# Patient Record
Sex: Male | Born: 1955 | Race: White | Hispanic: Refuse to answer | Marital: Married | Smoking: Former smoker
Health system: Southern US, Community
[De-identification: ages and names within clinical notes are randomized; demographics above are authoritative.]

## PROBLEM LIST (undated history)

## (undated) DIAGNOSIS — K620 Anal polyp: Secondary | ICD-10-CM

## (undated) DIAGNOSIS — G4761 Periodic limb movement disorder: Secondary | ICD-10-CM

## (undated) DIAGNOSIS — M109 Gout, unspecified: Secondary | ICD-10-CM

## (undated) DIAGNOSIS — E785 Hyperlipidemia, unspecified: Secondary | ICD-10-CM

## (undated) DIAGNOSIS — E119 Type 2 diabetes mellitus without complications: Secondary | ICD-10-CM

## (undated) DIAGNOSIS — G510 Bell's palsy: Secondary | ICD-10-CM

## (undated) DIAGNOSIS — E669 Obesity, unspecified: Secondary | ICD-10-CM

## (undated) DIAGNOSIS — J019 Acute sinusitis, unspecified: Secondary | ICD-10-CM

## (undated) DIAGNOSIS — I2510 Atherosclerotic heart disease of native coronary artery without angina pectoris: Secondary | ICD-10-CM

## (undated) DIAGNOSIS — E041 Nontoxic single thyroid nodule: Secondary | ICD-10-CM

## (undated) DIAGNOSIS — E559 Vitamin D deficiency, unspecified: Secondary | ICD-10-CM

## (undated) DIAGNOSIS — E042 Nontoxic multinodular goiter: Secondary | ICD-10-CM

## (undated) DIAGNOSIS — K5090 Crohn's disease, unspecified, without complications: Secondary | ICD-10-CM

## (undated) DIAGNOSIS — G459 Transient cerebral ischemic attack, unspecified: Secondary | ICD-10-CM

## (undated) DIAGNOSIS — G4730 Sleep apnea, unspecified: Secondary | ICD-10-CM

## (undated) DIAGNOSIS — I10 Essential (primary) hypertension: Secondary | ICD-10-CM

## (undated) HISTORY — DX: Acute sinusitis, unspecified: J01.90

## (undated) HISTORY — DX: Bell's palsy: G51.0

## (undated) HISTORY — DX: Gout, unspecified: M10.9

## (undated) HISTORY — DX: Transient cerebral ischemic attack, unspecified: G45.9

## (undated) HISTORY — DX: Vitamin D deficiency, unspecified: E55.9

## (undated) HISTORY — DX: Nontoxic multinodular goiter: E04.2

## (undated) HISTORY — DX: Nontoxic single thyroid nodule: E04.1

## (undated) HISTORY — DX: Sleep apnea, unspecified: G47.30

## (undated) HISTORY — DX: Crohn's disease, unspecified, without complications: K50.90

## (undated) HISTORY — PX: RECTAL POLYPECTOMY: SHX2309

## (undated) HISTORY — DX: Atherosclerotic heart disease of native coronary artery without angina pectoris: I25.10

## (undated) HISTORY — DX: Periodic limb movement disorder: G47.61

## (undated) HISTORY — DX: Obesity, unspecified: E66.9

## (undated) HISTORY — DX: Anal polyp: K62.0

---
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
## 1997-12-02 ENCOUNTER — Inpatient Hospital Stay (HOSPITAL_COMMUNITY): Admission: EM | Admit: 1997-12-02 | Discharge: 1997-12-03 | Payer: Self-pay | Admitting: Emergency Medicine

## 1997-12-02 ENCOUNTER — Encounter: Payer: Self-pay | Admitting: Emergency Medicine

## 1997-12-03 ENCOUNTER — Encounter: Payer: Self-pay | Admitting: Internal Medicine

## 1997-12-06 ENCOUNTER — Observation Stay (HOSPITAL_COMMUNITY): Admission: AD | Admit: 1997-12-06 | Discharge: 1997-12-07 | Payer: Self-pay | Admitting: Cardiology

## 1998-03-05 HISTORY — PX: OTHER SURGICAL HISTORY: SHX169

## 1998-03-15 ENCOUNTER — Observation Stay (HOSPITAL_COMMUNITY): Admission: AD | Admit: 1998-03-15 | Discharge: 1998-03-16 | Payer: Self-pay | Admitting: Cardiology

## 1998-04-24 ENCOUNTER — Encounter: Payer: Self-pay | Admitting: Cardiology

## 1998-04-24 ENCOUNTER — Emergency Department (HOSPITAL_COMMUNITY): Admission: EM | Admit: 1998-04-24 | Discharge: 1998-04-24 | Payer: Self-pay | Admitting: *Deleted

## 1998-05-15 ENCOUNTER — Ambulatory Visit (HOSPITAL_COMMUNITY): Admission: RE | Admit: 1998-05-15 | Discharge: 1998-05-15 | Payer: Self-pay | Admitting: Cardiology

## 1998-05-22 ENCOUNTER — Ambulatory Visit (HOSPITAL_COMMUNITY): Admission: RE | Admit: 1998-05-22 | Discharge: 1998-05-22 | Payer: Self-pay | Admitting: Gastroenterology

## 1998-05-22 ENCOUNTER — Encounter: Payer: Self-pay | Admitting: Gastroenterology

## 1998-08-19 ENCOUNTER — Emergency Department (HOSPITAL_COMMUNITY): Admission: EM | Admit: 1998-08-19 | Discharge: 1998-08-19 | Payer: Self-pay | Admitting: Otolaryngology

## 1998-08-19 ENCOUNTER — Encounter: Payer: Self-pay | Admitting: Interventional Cardiology

## 1998-08-19 ENCOUNTER — Encounter: Payer: Self-pay | Admitting: Emergency Medicine

## 1998-12-03 ENCOUNTER — Ambulatory Visit (HOSPITAL_COMMUNITY): Admission: RE | Admit: 1998-12-03 | Discharge: 1998-12-03 | Payer: Self-pay | Admitting: Gastroenterology

## 1998-12-03 ENCOUNTER — Encounter: Payer: Self-pay | Admitting: Gastroenterology

## 1998-12-03 ENCOUNTER — Encounter (INDEPENDENT_AMBULATORY_CARE_PROVIDER_SITE_OTHER): Payer: Self-pay | Admitting: Specialist

## 1999-01-13 ENCOUNTER — Encounter: Admission: RE | Admit: 1999-01-13 | Discharge: 1999-04-13 | Payer: Self-pay | Admitting: *Deleted

## 2000-11-29 ENCOUNTER — Encounter: Payer: Self-pay | Admitting: Gastroenterology

## 2000-11-29 ENCOUNTER — Encounter: Admission: RE | Admit: 2000-11-29 | Discharge: 2000-11-29 | Payer: Self-pay | Admitting: Gastroenterology

## 2001-04-20 ENCOUNTER — Inpatient Hospital Stay (HOSPITAL_COMMUNITY): Admission: AD | Admit: 2001-04-20 | Discharge: 2001-04-21 | Payer: Self-pay | Admitting: Interventional Cardiology

## 2003-06-09 ENCOUNTER — Ambulatory Visit (HOSPITAL_BASED_OUTPATIENT_CLINIC_OR_DEPARTMENT_OTHER): Admission: RE | Admit: 2003-06-09 | Discharge: 2003-06-09 | Payer: Self-pay | Admitting: Interventional Cardiology

## 2005-08-24 ENCOUNTER — Emergency Department (HOSPITAL_COMMUNITY): Admission: EM | Admit: 2005-08-24 | Discharge: 2005-08-24 | Payer: Self-pay | Admitting: Emergency Medicine

## 2006-02-02 HISTORY — PX: HEEL SPUR EXCISION: SHX1733

## 2006-05-07 ENCOUNTER — Ambulatory Visit (HOSPITAL_COMMUNITY): Admission: RE | Admit: 2006-05-07 | Discharge: 2006-05-07 | Payer: Self-pay | Admitting: General Surgery

## 2006-05-07 ENCOUNTER — Encounter (INDEPENDENT_AMBULATORY_CARE_PROVIDER_SITE_OTHER): Payer: Self-pay | Admitting: Specialist

## 2007-11-25 ENCOUNTER — Encounter: Admission: RE | Admit: 2007-11-25 | Discharge: 2007-11-25 | Payer: Self-pay | Admitting: Internal Medicine

## 2010-06-20 NOTE — H&P (Signed)
[facility]  Patient:    Jesus Stone, Jesus Stone Visit Number: [id number] MRN: [id number]          Service Type: MED Location: 2000 2017 01 Attending Physician:  Lyn Records. Iii Dictated by:   Anselm Lis, N.P. Admit Date:  04/20/2001   CC:         Jesus Stone, M.D.   History and Physical  PRIMARY CARE Jesus Stone:  Jesus Stone, M.D.  DATE OF BIRTH:  [birth year]  IMPRESSION: 1. Chest discomfort, similar, though less intense, to his typical angina.    Onset this morning.  Gradually relieved with five sprays of sublingual    nitrates.  His EKG in the clinic today was non-ischemic.  He has a history    of stent in the ramus placed in February of 2002.  Prior to this, he had a    PTCA of that same ramus.  Most recently a stress Cardiolite in July of 2000    was negative for ischemia with an EF of 56%. 2. History of Crohns disease followed by Jesus Stone, M.D.  On Asacol,    Purinethol, and p.r.n. Flagyl.  In 1993 Jesus Stone, M.D.) had    removal of terminal ileus (two feet of colon).  This surgery was    complicated by subsequent severe peritonitis and drainage of an abscess (he    was critically ill). 3. History of appendectomy in 1964 and testicular surgery in 1967. 4. Hyperlipidemia on Lopid with starting of Lipitor on March 30, 2001.  On    March 30, 2001, the cholesterol was 188, triglycerides 232, HDL 32, and    LDL 119. 5. History of elevated liver function tests elevated in the past by Jesus Stone L.    Randa Stone, M.D.  Liver biopsy in 2000 revealing steatohepatitis without    scarring.  Thought non-alcohol steatohepatitis (NASH).  Treatment would be    aggressive weight loss and low-fat diet.  PLAN: 1. The patient will be admitted to the hospital (telemetry) for rule out MI    protocol with serial cardiac enzymes and daily EKG. 2. Beta blocker therapy. 3. Continued other medications as before. 4. Further  recommendations and testing pending hospital course. 5. PPI.  HISTORY OF PRESENT ILLNESS:  Jesus Stone is a pleasant 55 year old male with a known history of dyslipidemia and single vessel coronary atherosclerotic heart disease; status post stent of ramus in 1999 with subsequently PTCA/stent of restenosis of ramus.  Yesterday morning, the patient had mild left anterior chest pressure which was transient (non-exertional).  This morning about 9 a.m., he had recurrent episodes of the same, but more intense.  Noted associated left neck and shoulder discomfort with swelling of left fingers.  Positive diaphoresis, but no nausea or shortness of breath.  The left anterior chest discomfort was described as "sharp/pressure" and continuous.  Five sprays of sublingual nitrates resulted in gradual relief over the subsequent hour.  In our clinic today, EKG revealed NSR without ischemia.  He is currently with only very mild anterior chest discomfort.  This is not like his GERD symptoms. He has residual malaise.  ALLERGIES:  No known drug allergies.  Okay with seafood, shellfish, and iodine products.  MEDICATIONS: 1. Gemfibrozil 300 mg p.o. b.i.d. 2. Asacol 40 mg two tablets p.o. t.i.d. 3. Purinethol 50 mg one-and-a-half tablets p.o. q.d. 4. Flagyl 250 mg t.i.d. p.r.n. for a 10-day course. 5. Aspirin 325 mg p.o. q.d. 6. Sublingual nitrates  p.r.n. 7. Multivitamins with vitamin C once daily. 8. Lipitor 5 mg p.o. q.d. started on March 30, 2001.  SOCIAL HISTORY AND HABITS:  Tobacco:  Remote.  He did smoke two-and-a-half packs per day for age [age].  He has not smoked in the last 17 years.  ETOH: Negative.  Caffeine:  Intermittently a bit in the morning.  Works as a Advice worker at [employer].  He is married.  His wife is 35 and in good health.  He has a 58 year old daughter and a 67 year old son in good health.  FAMILY HISTORY:  His father died at age 44 with question of a heart  attack. His dad had rheumatic heart disease following scarlet fever as a child.  His mother has had breast cancer, but is age 26 and in good health.  The patient has one sister, age 6, with obesity, hyperglycemia, irritable bowel syndrome, and migraine headaches and a 71 year old brother in good health.  REVIEW OF SYSTEMS:  As HPI, social history, and past medical history, otherwise denies lightheadedness, dizziness, syncope, and near syncopal episodes.  He does have episodic symptoms of heartburn, feeling as if something gets stuck in his esophagus.  Denies orthopnea nor PND.  Negative pedal edema.  PHYSICAL EXAMINATION:  The blood pressure is 130/90, temperature 97.9 degrees, respiratory rate 16, and O2 saturation 99% on room air.  Initially the blood pressure was 170/120 (small cuff) and recheck 130/92.  WEIGHT:  200 pounds.  GENERAL APPEARANCE:  He is a well-nourished, overweight, 55 year old male in no apparent distress.  Alert and pleasantly conversant.  NECK:  Brisk bilateral carotid upstroke without bruit.  No significant JVD. No thyromegaly.  CHEST:  Lung sounds clear with distant scant left basilar crackles, inspiratory.  Negative CVA tenderness.  CARDIAC:  Regular rate and rhythm without murmurs, rubs, or gallops.  Normal S1 and S2.  ABDOMEN:  Obese, soft, and nondistended.  Normoactive bowel sounds.  Negative abdominal aorta, renal, or femoral bruit.  Nontender to applied pressure.  No masses.  No organomegaly appreciated.  EXTREMITIES:  Distal pulses intact.  Negative pedal edema.  NEUROLOGIC:  Cranial nerves II-XII grossly intact.  Alert and oriented x 3.  GENITALIA:  Exam deferred.  RECTAL:  Exam deferred.  LABORATORY TESTS AND DATA:  EKG revealed NSR at 68 beats per minute with no ischemic changes.  His WBC was 4.1, hemoglobin 12.9, hematocrit 36.5, and platelets 253.  Pro time 13.9, INR 1.0, PTT 24.  Sodium 138, K 4.3, chloride 107, CO2 23,  glucose  99, BUN 12, creatinine 1.0.  LFTs within normal range, though SGOT elevated at 73 and SGPT elevated at 74.  This was compared to prior labs on March 30, 2001, when the SGOT was 85 and the SGPT was 114.  Total protein 7.7, albumin 3.8, calcium 9.2.  CK 107, MB fraction 1.2, troponin I 0.01.  Laboratory work from March 30, 2001, revealed a cholesterol of 188, triglycerides 232, HDL 32, and LDL 119.  His UA on March 30, 2001, was negative. Dictated by:   Anselm Lis, N.P. Attending Physician:  Lyn Records. Iii DD:  04/20/01 TD:  04/21/01 Job: [id number]

## 2010-06-20 NOTE — Op Note (Signed)
NAMEABU, HEAVIN              ACCOUNT NO.:  [id number]   MEDICAL RECORD NO.:  [id number]          PATIENT TYPE:  AMB   LOCATION:  DAY                          FACILITY:  [facility]   PHYSICIAN:  Leonie Man, M.D.   DATE OF BIRTH:  1956-01-04   DATE OF PROCEDURE:  05/07/2006  DATE OF DISCHARGE:                               OPERATIVE REPORT   PREOPERATIVE DIAGNOSIS:  Anal polyp and Crohn disease.   POSTOPERATIVE DIAGNOSIS:  Anal polyp and Crohn disease.   PROCEDURE:  Examination under anesthesia, excision of anal polyp and  proctosigmoidoscopy.   SURGEON:  Leonie Man, M.D.   ASSISTANT:  OR technician.   ANESTHESIA:  General.   POSITION:  Prone.   SPECIMENS TO THE LAB:  Anal polyp.   COMPLICATIONS:  None.  The patient returned to the PACU in excellent  condition.   NOTE:  Mr. Ketcher is a 55 year old gentleman with a diagnosis of Crohn  disease currently on multiple medications for this including  mercaptopurine, Asacol.  He was seen recently by Dr. Vilinda Boehringer and  noted to have a large hypertrophic polyp.  Biopsy at that time had shown  only a hypertrophic polyp.  Because the remainder of the polyp is in  place, the patient was referred for further evaluation.   PROCEDURE REPORT:  The patient is positioned in the prone jackknife  position and the perianal tissues prepped and draped to be included in  the sterile operative field following the induction of satisfactory  general anesthesia.  Anal dilatation carried to be 3 fingerbreadths and  after a perianal block of 0.5% Marcaine with epinephrine was done.  The  operating anoscope was introduced into the anus and upon inspection  there was first an anterior midline fissure which was relatively acute,  as well as a hypertrophic polyp positioned at approximately the 8  o'clock position with the patient in the prone position.  The polyp was  grasped and with the use of electric cautery.  The entire base of the  polyp  along with a portion of the mucosa was removed in its entirety and  forwarded for pathologic evaluation.  There was no bleeding.  I then did  a very thorough and visual evaluation of the anorectum.  There were no  additional lesions noted in this region.  I then used a rigid  sigmoidoscope and sigmoidoscoped the patient up to 20 cm with no  additional lesions being noted.  The patient did have a significantly  increased amount of mucus within the colon and rectum and this is  consistent with his Crohn disease.  There were no additional  hypertrophic polyps.  There were no  areas of stricturing or inflammation noted.  I then placed a Gelfoam pad  over the anterior anal fissure, placed a sterile dressing on the  incision.  The anesthetic was then reversed and the patient removed from  the operating room to the recovery room in stable condition.  He  tolerated the procedure well.      Leonie Man, M.D.  Electronically Signed     PB/MEDQ  D:  05/07/2006  T:  05/07/2006  Job:  [id number]   cc:   Fayrene Fearing L. Malon Kindle., M.D.  Fax: [phone]   Candyce Churn, M.D.  Fax: [phone]

## 2011-07-28 ENCOUNTER — Other Ambulatory Visit (HOSPITAL_COMMUNITY): Payer: Self-pay | Admitting: Internal Medicine

## 2011-07-28 DIAGNOSIS — E041 Nontoxic single thyroid nodule: Secondary | ICD-10-CM

## 2011-08-04 ENCOUNTER — Ambulatory Visit (HOSPITAL_COMMUNITY): Payer: Self-pay

## 2011-08-04 ENCOUNTER — Ambulatory Visit (HOSPITAL_COMMUNITY)
Admission: RE | Admit: 2011-08-04 | Discharge: 2011-08-04 | Disposition: A | Discharge status: Home / Self Care | Payer: 59 | Source: Ambulatory Visit | Attending: Internal Medicine | Admitting: Internal Medicine

## 2011-08-04 ENCOUNTER — Encounter (HOSPITAL_COMMUNITY)
Admission: RE | Admit: 2011-08-04 | Discharge: 2011-08-04 | Disposition: A | Discharge status: Home / Self Care | Payer: 59 | Source: Ambulatory Visit | Attending: Internal Medicine | Admitting: Internal Medicine

## 2011-08-04 ENCOUNTER — Other Ambulatory Visit (HOSPITAL_COMMUNITY): Payer: Self-pay | Admitting: Internal Medicine

## 2011-08-04 DIAGNOSIS — E041 Nontoxic single thyroid nodule: Secondary | ICD-10-CM

## 2011-08-04 DIAGNOSIS — R220 Localized swelling, mass and lump, head: Secondary | ICD-10-CM | POA: Insufficient documentation

## 2011-08-04 DIAGNOSIS — E049 Nontoxic goiter, unspecified: Secondary | ICD-10-CM | POA: Insufficient documentation

## 2011-08-04 MED ORDER — SODIUM PERTECHNETATE TC 99M INJECTION
10.0000 | Freq: Once | INTRAVENOUS | Status: AC | PRN
  Administered 2011-08-04: 10 via INTRAVENOUS

## 2011-08-05 ENCOUNTER — Encounter (HOSPITAL_COMMUNITY): Payer: Self-pay

## 2011-08-05 ENCOUNTER — Other Ambulatory Visit (HOSPITAL_COMMUNITY): Payer: 59

## 2011-09-30 ENCOUNTER — Other Ambulatory Visit: Payer: Self-pay | Admitting: Internal Medicine

## 2011-09-30 DIAGNOSIS — E041 Nontoxic single thyroid nodule: Secondary | ICD-10-CM

## 2011-10-01 ENCOUNTER — Ambulatory Visit
Admission: RE | Admit: 2011-10-01 | Discharge: 2011-10-01 | Disposition: A | Discharge status: Home / Self Care | Payer: 59 | Source: Ambulatory Visit | Attending: Internal Medicine | Admitting: Internal Medicine

## 2011-10-01 ENCOUNTER — Other Ambulatory Visit (HOSPITAL_COMMUNITY)
Admission: RE | Admit: 2011-10-01 | Discharge: 2011-10-01 | Disposition: A | Discharge status: Home / Self Care | Payer: 59 | Source: Ambulatory Visit | Attending: Interventional Radiology | Admitting: Interventional Radiology

## 2011-10-01 DIAGNOSIS — E041 Nontoxic single thyroid nodule: Secondary | ICD-10-CM

## 2011-10-01 DIAGNOSIS — E049 Nontoxic goiter, unspecified: Secondary | ICD-10-CM | POA: Insufficient documentation

## 2012-04-01 ENCOUNTER — Other Ambulatory Visit: Payer: Self-pay | Admitting: Internal Medicine

## 2012-04-01 DIAGNOSIS — E049 Nontoxic goiter, unspecified: Secondary | ICD-10-CM

## 2012-04-05 ENCOUNTER — Ambulatory Visit
Admission: RE | Admit: 2012-04-05 | Discharge: 2012-04-05 | Disposition: A | Discharge status: Home / Self Care | Payer: 59 | Source: Ambulatory Visit | Attending: Internal Medicine | Admitting: Internal Medicine

## 2012-04-05 DIAGNOSIS — E049 Nontoxic goiter, unspecified: Secondary | ICD-10-CM

## 2013-02-22 ENCOUNTER — Ambulatory Visit
Admission: RE | Admit: 2013-02-22 | Discharge: 2013-02-22 | Disposition: A | Discharge status: Home / Self Care | Payer: 59 | Source: Ambulatory Visit | Attending: Internal Medicine | Admitting: Internal Medicine

## 2013-02-22 ENCOUNTER — Other Ambulatory Visit: Payer: Self-pay | Admitting: Internal Medicine

## 2013-02-22 DIAGNOSIS — R51 Headache: Principal | ICD-10-CM

## 2013-02-22 DIAGNOSIS — R519 Headache, unspecified: Secondary | ICD-10-CM

## 2013-03-01 ENCOUNTER — Ambulatory Visit (INDEPENDENT_AMBULATORY_CARE_PROVIDER_SITE_OTHER): Payer: 59

## 2013-03-01 ENCOUNTER — Telehealth: Payer: Self-pay | Admitting: Diagnostic Neuroimaging

## 2013-03-01 ENCOUNTER — Ambulatory Visit (INDEPENDENT_AMBULATORY_CARE_PROVIDER_SITE_OTHER): Payer: 59 | Admitting: Diagnostic Neuroimaging

## 2013-03-01 ENCOUNTER — Encounter: Payer: Self-pay | Admitting: Diagnostic Neuroimaging

## 2013-03-01 ENCOUNTER — Encounter (INDEPENDENT_AMBULATORY_CARE_PROVIDER_SITE_OTHER): Payer: Self-pay

## 2013-03-01 VITALS — BP 159/84 | HR 68 | Temp 96.7°F | Ht 71.0 in | Wt 250.0 lb

## 2013-03-01 DIAGNOSIS — R42 Dizziness and giddiness: Secondary | ICD-10-CM

## 2013-03-01 DIAGNOSIS — M542 Cervicalgia: Secondary | ICD-10-CM

## 2013-03-01 DIAGNOSIS — G459 Transient cerebral ischemic attack, unspecified: Secondary | ICD-10-CM

## 2013-03-01 DIAGNOSIS — R209 Unspecified disturbances of skin sensation: Secondary | ICD-10-CM

## 2013-03-01 DIAGNOSIS — R51 Headache: Secondary | ICD-10-CM

## 2013-03-01 DIAGNOSIS — R200 Anesthesia of skin: Secondary | ICD-10-CM

## 2013-03-01 MED ORDER — GADOPENTETATE DIMEGLUMINE 469.01 MG/ML IV SOLN: 20.0000 mL | Freq: Once | INTRAVENOUS | Status: AC | PRN

## 2013-03-01 NOTE — Patient Instructions (Signed)
I have setup MRIs for today, 03/01/13. Please be here at [facility] at 12:30pm for MRI scans.

## 2013-03-01 NOTE — Progress Notes (Signed)
[facility]  PATIENT: Jesus Stone DOB: Oct 07, 1955  REFERRING CLINICIAN: Inda Merlin HISTORY FROM: patient  REASON FOR VISIT: new consult   HISTORICAL  CHIEF COMPLAINT:  Chief Complaint  Patient presents with  . Headache    "Top of head"    HISTORY OF PRESENT ILLNESS:  58 year old history of hypertension, hypercholesterolemia, Crohn's disease, coronary artery disease, status post cardiac stents, here for evaluation of headaches, blurred vision, dizziness.  2-3 weeks ago patient was doing pushups at home when he developed sharp pain on the top of his head. This is associated with blurred vision, dizziness and lightheadedness. Patient had to rest for a few minutes and refer symptoms to reduce. Patient also felt some pressure sensation in the head, numbness in his mouth and nose, nausea, photophobia and neck stiffness over the next few days.  3 years ago patient had episode of aching of space in the yard and feeling some dizziness. One half years ago patient had a one-hour episode of right arm weakness that resolved. Also one half years ago patient had episode of right jaw numbness, right hemi-face numbness, right face drooping sensation lasting for couple of days. Patient did not seek medical attention for any of these.  Patient does not feel well today. He feels that something is severely wrong.  REVIEW OF SYSTEMS: Full 14 system review of systems performed and notable only for sleepiness memory loss headache numbness dizziness blurred vision eye pain weight gain snoring, mild sleep apnea diagnosed several years ago not on treatment.  ALLERGIES: Allergies  Allergen Reactions  . Lipitor [Atorvastatin]   . Simvastatin     HOME MEDICATIONS: No outpatient prescriptions prior to visit.   No facility-administered medications prior to visit.    PAST MEDICAL HISTORY: Past Medical History  Diagnosis Date  . Headache(784.0)   . Mixed hyperlipidemia   . Nausea  alone   . Visual discomfort   . Disturbance of skin sensation   . CAD (coronary artery disease)   . Crohn's disease   . Sleep apnea   . Obesity   . Anal polyp   . Bell's palsy 2009  . Thyroid nodule     left  . TIA (transient ischemic attack) 06/2011  . Acute sinusitis, unspecified   . Gout, unspecified   . Unspecified vitamin D deficiency   . Coronary atherosclerosis of unspecified type of vessel, native or graft   . Regional enteritis of unspecified site   . Periodic limb movement disorder   . Nontoxic multinodular goiter     PAST SURGICAL HISTORY: Past Surgical History  Procedure Laterality Date  . Appendectomy  1964  . Testicle surgery  1967  . Coronary artery stent placment  03/1998  . Rectal polypectomy  2008, 2009  . Heel spur excision Right 2008    FAMILY HISTORY: Family History  Problem Relation Age of Onset  . Cancer Mother   . Diabetes Sister   . Cancer Maternal Grandmother   . Cancer Paternal Aunt     SOCIAL HISTORY:  History   Social History  . Marital Status: Married    Spouse Name: N/A    Number of Children: N/A  . Years of Education: N/A   Occupational History  . Not on file.   Social History Main Topics  . Smoking status: Former Smoker    Quit date: 03/01/1984  . Smokeless tobacco: Not on file  . Alcohol Use: No  . Drug Use: No  . Sexual Activity: Yes  Other Topics Concern  . Not on file   Social History Narrative   Right handed male     PHYSICAL EXAM  Filed Vitals:   03/01/13 0901  BP: 159/84  Pulse: 68  Temp: 96.7 F (35.9 C)  TempSrc: Oral  Height: 5\' 11"  (1.803 m)  Weight: 250 lb (113.399 kg)    Not recorded    Body mass index is 34.88 kg/(m^2).  GENERAL EXAM: Patient is in no distress; well developed, nourished and groomed; neck is supple  CARDIOVASCULAR: Regular rate and rhythm, no murmurs, no carotid bruits  NEUROLOGIC: MENTAL STATUS: awake, alert, oriented to person, place and time, recent and remote  memory intact, normal attention and concentration, language fluent, comprehension intact, naming intact, fund of knowledge appropriate CRANIAL NERVE: no papilledema on fundoscopic exam, pupils equal and reactive to light, visual fields full to confrontation, extraocular muscles intact, no nystagmus, facial sensation and strength symmetric, hearing intact, palate elevates symmetrically, uvula midline, shoulder shrug symmetric, tongue midline. MOTOR: normal bulk and tone, full strength in the BUE, BLE SENSORY: normal and symmetric to light touch, pinprick, temperature, vibration COORDINATION: finger-nose-finger, fine finger movements normal REFLEXES: deep tendon reflexes present and symmetric GAIT/STATION: narrow based gait; able to walk on toes, heels and tandem; romberg is negative    DIAGNOSTIC DATA (LABS, IMAGING, TESTING) - I reviewed patient records, labs, notes, testing and imaging myself where available.  No results found for this basename: WBC, HGB, HCT, MCV, PLT   No results found for this basename: na, k, cl, co2, glucose, bun, creatinine, calcium, prot, albumin, ast, alt, alkphos, bilitot, gfrnonaa, gfraa   No results found for this basename: CHOL, HDL, LDLCALC, LDLDIRECT, TRIG, CHOLHDL   No results found for this basename: HGBA1C   No results found for this basename: VITAMINB12   No results found for this basename: TSH    I reviewed images myself and agree with interpretation.   02/22/13 CT head - no acute findings   ASSESSMENT AND PLAN  58 y.o. year old male here with new onset episode of headache, blurred vision, lightheadedness, numbness, nausea, photophobia neck this. Could represent migraine phenomenon. Other possibilities include vertebrobasilar TIA, vertebral dissection, subarachnoid hemorrhage. Needs further workup.  Ddx: TIA/stroke (vertebrobasilar), vertebral dissection, subarachnoid hemorrhage, migraine variant, peripheral vestibulopathy  PLAN: - expedited  workup with MRI, MRAs today - labs and TTE - continue aspirin 81mg  daily + statin - may need to consider sleep study after TIA workup (prior dx of mild sleep apnea, with worsening daytime fatigue, obesity, confusion and memory issues)   Orders Placed This Encounter  Procedures  . MR Brain Wo Contrast  . MR MRA HEAD WO CONTRAST  . MR Angiogram Neck W Wo Contrast  . Lipid Panel  . Hemoglobin A1c  . TSH  . Vitamin B12  . Sedimentation Rate  . C-reactive Protein  . 2D Echocardiogram without contrast    Return in about 1 month (around 04/01/2013).  I reviewed images, labs, notes, records myself. I summarized findings and reviewed with patient, for this high risk condition (possible TIA/stroke) requiring high complexity decision making.    Penni Bombard, MD 2/58/5277, 82:42 AM Certified in Neurology, Neurophysiology and Neuroimaging  [facility] [address] [place], [place] [address] [phone]

## 2013-03-01 NOTE — Telephone Encounter (Signed)
Pt needs 63mo per instructions

## 2013-03-02 LAB — LIPID PANEL
Chol/HDL Ratio: 3.7 ratio units (ref 0.0–5.0)
Cholesterol, Total: 147 mg/dL (ref 100–199)
HDL: 40 mg/dL (ref >39)
LDL Calculated: 59 mg/dL (ref 0–99)
Triglycerides: 238 mg/dL — ABNORMAL HIGH (ref 0–149)
VLDL Cholesterol Cal: 48 mg/dL — ABNORMAL HIGH (ref 5–40)

## 2013-03-02 LAB — SEDIMENTATION RATE: Sed Rate: 4 mm/hr (ref 0–30)

## 2013-03-02 LAB — TSH: TSH: 0.802 u[IU]/mL (ref 0.450–4.500)

## 2013-03-02 LAB — VITAMIN B12: Vitamin B-12: 376 pg/mL (ref 211–946)

## 2013-03-02 LAB — HEMOGLOBIN A1C
Est. average glucose Bld gHb Est-mCnc: 157 mg/dL
Hgb A1c MFr Bld: 7.1 % — ABNORMAL HIGH (ref 4.8–5.6)

## 2013-03-02 LAB — C-REACTIVE PROTEIN: CRP: 2.1 mg/L (ref 0.0–4.9)

## 2013-03-03 ENCOUNTER — Telehealth: Payer: Self-pay | Admitting: *Deleted

## 2013-03-03 NOTE — Telephone Encounter (Signed)
Patient requesting MRI and MRA results. Please advise.

## 2013-03-06 NOTE — Telephone Encounter (Signed)
I called patient. Reviewed test results. Recommend f/u with PCP re: A1c 7.1. Continue aspirin and statin.   Penni Bombard, MD 10/07/8014, 5:53 PM Certified in Neurology, Neurophysiology and Neuroimaging  [facility] [address], [place], [place] [address] [phone]

## 2013-03-06 NOTE — Telephone Encounter (Signed)
Called patient. Scheduled 1 month f/u. Patient requesting MRI and lab results.

## 2013-03-21 NOTE — Telephone Encounter (Signed)
Done

## 2013-03-22 ENCOUNTER — Other Ambulatory Visit: Payer: Self-pay | Admitting: Internal Medicine

## 2013-03-22 DIAGNOSIS — E042 Nontoxic multinodular goiter: Secondary | ICD-10-CM

## 2013-03-23 ENCOUNTER — Ambulatory Visit (HOSPITAL_COMMUNITY): Payer: 59 | Attending: Internal Medicine | Admitting: Radiology

## 2013-03-23 ENCOUNTER — Encounter: Payer: Self-pay | Admitting: Internal Medicine

## 2013-03-23 DIAGNOSIS — I2510 Atherosclerotic heart disease of native coronary artery without angina pectoris: Secondary | ICD-10-CM | POA: Insufficient documentation

## 2013-03-23 DIAGNOSIS — G459 Transient cerebral ischemic attack, unspecified: Secondary | ICD-10-CM | POA: Insufficient documentation

## 2013-03-23 DIAGNOSIS — E669 Obesity, unspecified: Secondary | ICD-10-CM | POA: Insufficient documentation

## 2013-03-23 DIAGNOSIS — M542 Cervicalgia: Secondary | ICD-10-CM

## 2013-03-23 DIAGNOSIS — R42 Dizziness and giddiness: Secondary | ICD-10-CM

## 2013-03-23 DIAGNOSIS — I10 Essential (primary) hypertension: Secondary | ICD-10-CM | POA: Insufficient documentation

## 2013-03-23 DIAGNOSIS — E785 Hyperlipidemia, unspecified: Secondary | ICD-10-CM | POA: Insufficient documentation

## 2013-03-23 DIAGNOSIS — R200 Anesthesia of skin: Secondary | ICD-10-CM

## 2013-03-23 DIAGNOSIS — R51 Headache: Secondary | ICD-10-CM

## 2013-03-23 NOTE — Progress Notes (Signed)
Echocardiogram performed.  

## 2013-03-28 ENCOUNTER — Ambulatory Visit
Admission: RE | Admit: 2013-03-28 | Discharge: 2013-03-28 | Disposition: A | Discharge status: Home / Self Care | Payer: 59 | Source: Ambulatory Visit | Attending: Internal Medicine | Admitting: Internal Medicine

## 2013-03-28 DIAGNOSIS — E042 Nontoxic multinodular goiter: Secondary | ICD-10-CM

## 2013-03-31 ENCOUNTER — Ambulatory Visit (INDEPENDENT_AMBULATORY_CARE_PROVIDER_SITE_OTHER): Payer: 59 | Admitting: Diagnostic Neuroimaging

## 2013-03-31 ENCOUNTER — Other Ambulatory Visit: Payer: Self-pay | Admitting: Internal Medicine

## 2013-03-31 ENCOUNTER — Encounter (INDEPENDENT_AMBULATORY_CARE_PROVIDER_SITE_OTHER): Payer: Self-pay

## 2013-03-31 ENCOUNTER — Encounter: Payer: Self-pay | Admitting: Diagnostic Neuroimaging

## 2013-03-31 VITALS — BP 118/73 | HR 63 | Ht 71.0 in | Wt 244.0 lb

## 2013-03-31 DIAGNOSIS — R5383 Other fatigue: Secondary | ICD-10-CM

## 2013-03-31 DIAGNOSIS — G4730 Sleep apnea, unspecified: Secondary | ICD-10-CM

## 2013-03-31 DIAGNOSIS — G459 Transient cerebral ischemic attack, unspecified: Secondary | ICD-10-CM

## 2013-03-31 DIAGNOSIS — E041 Nontoxic single thyroid nodule: Secondary | ICD-10-CM

## 2013-03-31 DIAGNOSIS — R5381 Other malaise: Secondary | ICD-10-CM

## 2013-03-31 NOTE — Progress Notes (Signed)
[facility]  PATIENT: Jesus Stone DOB: 22-Jan-1956  REFERRING CLINICIAN: Inda Merlin HISTORY FROM: patient  REASON FOR VISIT: follow up   HISTORICAL  CHIEF COMPLAINT:  Chief Complaint  Patient presents with  . Follow-up    1 mo, Rm 7    HISTORY OF PRESENT ILLNESS:   UPDATE 03/31/13: Since last visit, feels much better. Has cut out bread from his diet. Has reduced sugar intake. Plans to see Dr. Buddy Duty for diabetes and thyroid follow up.   PRIOR HPI (03/01/13): 58 year old history of hypertension, hypercholesterolemia, Crohn's disease, coronary artery disease, status post cardiac stents, here for evaluation of headaches, blurred vision, dizziness.  2-3 weeks ago patient was doing pushups at home when he developed sharp pain on the top of his head. This is associated with blurred vision, dizziness and lightheadedness. Patient had to rest for a few minutes and refer symptoms to reduce. Patient also felt some pressure sensation in the head, numbness in his mouth and nose, nausea, photophobia and neck stiffness over the next few days.  3 years ago patient had episode of aching of space in the yard and feeling some dizziness. One half years ago patient had a one-hour episode of right arm weakness that resolved. Also one half years ago patient had episode of right jaw numbness, right hemi-face numbness, right face drooping sensation lasting for couple of days. Patient did not seek medical attention for any of these.  Patient does not feel well today. He feels that something is severely wrong.  REVIEW OF SYSTEMS: Full 14 system review of systems performed and notable only for mild sleep apnea diagnosed several years ago not on treatment.  ALLERGIES: Allergies  Allergen Reactions  . Lipitor [Atorvastatin]   . Simvastatin     HOME MEDICATIONS: Outpatient Prescriptions Prior to Visit  Medication Sig Dispense Refill  . aspirin 81 MG tablet Take 81 mg by mouth daily.       . Cholecalciferol (VITAMIN D3) 2000 UNITS TABS Take 2 capsules by mouth daily.      . DELZICOL 400 MG CPDR DR capsule Take 1 capsule by mouth daily.      . mercaptopurine (PURINETHOL) 50 MG tablet Take 1 tablet by mouth daily.      . metoprolol tartrate (LOPRESSOR) 25 MG tablet Take 2 tablets by mouth daily.      . Omega-3 1000 MG CAPS Take 2 capsules by mouth daily.      . rosuvastatin (CRESTOR) 5 MG tablet Take 5 mg by mouth daily.      . TRICOR 145 MG tablet Take 1 tablet by mouth daily.      . valsartan-hydrochlorothiazide (DIOVAN-HCT) 160-12.5 MG per tablet Take 1 tablet by mouth daily.      . Melatonin 5 MG TABS Take 2 tablets by mouth at bedtime.       No facility-administered medications prior to visit.    PAST MEDICAL HISTORY: Past Medical History  Diagnosis Date  . Headache(784.0)   . Mixed hyperlipidemia   . Nausea alone   . Visual discomfort   . Disturbance of skin sensation   . CAD (coronary artery disease)   . Crohn's disease   . Sleep apnea   . Obesity   . Anal polyp   . Bell's palsy 2009  . Thyroid nodule     left  . TIA (transient ischemic attack) 06/2011  . Acute sinusitis, unspecified   . Gout, unspecified   . Unspecified vitamin D deficiency   .  Coronary atherosclerosis of unspecified type of vessel, native or graft   . Regional enteritis of unspecified site   . Periodic limb movement disorder   . Nontoxic multinodular goiter     PAST SURGICAL HISTORY: Past Surgical History  Procedure Laterality Date  . Appendectomy  1964  . Testicle surgery  1967  . Coronary artery stent placment  03/1998  . Rectal polypectomy  2008, 2009  . Heel spur excision Right 2008    FAMILY HISTORY: Family History  Problem Relation Age of Onset  . Cancer Mother   . Diabetes Sister   . Cancer Maternal Grandmother   . Cancer Paternal Aunt     SOCIAL HISTORY:  History   Social History  . Marital Status: Married    Spouse Name: sharon    Number of Children: 2    . Years of Education: college   Occupational History  . [employer]    Social History Main Topics  . Smoking status: Former Smoker    Quit date: 03/01/1984  . Smokeless tobacco: Not on file  . Alcohol Use: No  . Drug Use: No  . Sexual Activity: Yes   Other Topics Concern  . Not on file   Social History Narrative   Right handed male     PHYSICAL EXAM  Filed Vitals:   03/31/13 1119  BP: 118/73  Pulse: 63  Height: 5\' 11"  (1.803 m)  Weight: 244 lb (110.678 kg)    Not recorded    Body mass index is 34.05 kg/(m^2).  GENERAL EXAM: Patient is in no distress; well developed, nourished and groomed; neck is supple  CARDIOVASCULAR: Regular rate and rhythm, no murmurs, no carotid bruits  NEUROLOGIC: MENTAL STATUS: awake, alert, oriented to person, place and time, recent and remote memory intact, normal attention and concentration, language fluent, comprehension intact, naming intact, fund of knowledge appropriate CRANIAL NERVE: no papilledema on fundoscopic exam, pupils equal and reactive to light, visual fields full to confrontation, extraocular muscles intact, no nystagmus, facial sensation and strength symmetric, hearing intact, palate elevates symmetrically, uvula midline, shoulder shrug symmetric, tongue midline. MOTOR: normal bulk and tone, full strength in the BUE, BLE SENSORY: normal and symmetric to light touch, temperature, vibration COORDINATION: finger-nose-finger, fine finger movements normal REFLEXES: deep tendon reflexes present and symmetric GAIT/STATION: narrow based gait; able to walk tandem; romberg is negative    DIAGNOSTIC DATA (LABS, IMAGING, TESTING) - I reviewed patient records, labs, notes, testing and imaging myself where available.  No results found for this basename: WBC,  HGB,  HCT,  MCV,  PLT   No results found for this basename: na,  k,  cl,  co2,  glucose,  bun,  creatinine,  calcium,  prot,  albumin,  ast,  alt,  alkphos,  bilitot,   gfrnonaa,  gfraa   Lab Results  Component Value Date   HGBA1C 7.1* 03/01/2013   Lab Results  Component Value Date   VITAMINB12 376 03/01/2013   Lab Results  Component Value Date   TSH 0.802 03/01/2013   Lab Results  Component Value Date   HDL 40 03/01/2013   LDLCALC 59 03/01/2013   TRIG 238* 03/01/2013   CHOLHDL 3.7 03/01/2013   I reviewed images myself and agree with interpretation. -VRP  02/22/13 CT head - no acute findings  03/02/13 MRI brain (without) demonstrating:  1. Small (60mm) left frontal juxtacortical focus of non-specific gliosis.  2. No acute findings.  03/02/13 MRA head (without) - normal  03/02/13  MRA neck (with and without) - normal  03/23/13 TTE - EF 60-65%; no cardiac source of emboli was indentified.    ASSESSMENT AND PLAN  58 y.o. year old male here with new onset episode of headache, blurred vision, lightheadedness, numbness, nausea, photophobia neck stiffness in Jan 2015. Now doing better. TIA workup completed.  Ddx: TIA/stroke (vertebrobasilar), migraine variant, peripheral vestibulopathy   PLAN: - continue aspirin 81mg  daily + statin - diabetes and lipid mgmt per PCP and endocrinology - will check sleep study (prior dx of mild sleep apnea, with worsening daytime fatigue, obesity, confusion and memory issues)  Orders Placed This Encounter  Procedures  . Ambulatory referral to Sleep Studies   Return in about 6 months (around 09/28/2013).   Penni Bombard, MD 02/21/1476, 29:56 AM Certified in Neurology, Neurophysiology and Neuroimaging  [facility] [address] [place], [place] [address] [phone]

## 2013-03-31 NOTE — Patient Instructions (Signed)

## 2013-04-03 ENCOUNTER — Telehealth: Payer: Self-pay | Admitting: Neurology

## 2013-04-03 DIAGNOSIS — I25810 Atherosclerosis of coronary artery bypass graft(s) without angina pectoris: Secondary | ICD-10-CM

## 2013-04-03 DIAGNOSIS — G4733 Obstructive sleep apnea (adult) (pediatric): Secondary | ICD-10-CM

## 2013-04-03 DIAGNOSIS — E669 Obesity, unspecified: Secondary | ICD-10-CM

## 2013-04-03 NOTE — Telephone Encounter (Signed)
Dr. Leta Baptist, refers patient for attended sleep study.  Height: 5'11  Weight: 244 lbs.  BMI: 34.05  Past Medical History:  Headache(784.0)  Mixed hyperlipidemia  Nausea alone  Visual discomfort  Disturbance of skin sensation  CAD (coronary artery disease)  Crohn's disease  Sleep apnea  Obesity  Anal polyp  Bell's palsy  2009  Thyroid nodule  left  TIA (transient ischemic attack)  06/2011  Acute sinusitis, unspecified  Gout, unspecified  Unspecified vitamin D deficiency  Coronary atherosclerosis of unspecified type of vessel, native or graft  Regional enteritis of unspecified site  Periodic limb movement disorder  Nontoxic multinodular goiter    Sleep Symptoms: prior dx of mild sleep apnea, with worsening daytime fatigue, obesity, confusion and memory issues)   Epworth Score: 12  Medications:  Aspirin (Tab) aspirin 81 MG Take 81 mg by mouth daily. Cholecalciferol (Tab) Vitamin D3 2000 UNITS Take 2 capsules by mouth daily. Fenofibrate (Tab) TRICOR 145 MG Take 1 tablet by mouth daily. Mercaptopurine (Tab) PURINETHOL 50 MG Take 1 tablet by mouth daily. Mesalamine (Capsule Delayed Release) DELZICOL 400 MG Take 1 capsule by mouth daily. Metoprolol Tartrate (Tab) LOPRESSOR 25 MG Take 2 tablets by mouth daily. Omega-3 Fatty Acids (Cap) Omega-3 1000 MG Take 2 capsules by mouth daily. Rosuvastatin Calcium (Tab) CRESTOR 5 MG Take 5 mg by mouth daily. Valsartan-Hydrochlorothiazide (Tab) DIOVAN-HCT 160-12.5 MG Take 1 tablet by mouth daily.   Insurance: [employer]  Dr. Gladstone Lighter  ASSESSMENT AND PLAN  58 y.o. year old male here with new onset episode of headache, blurred vision, lightheadedness, numbness, nausea, photophobia neck stiffness in Jan 2015. Now doing better. TIA workup completed.  Ddx: TIA/stroke (vertebrobasilar), migraine variant, peripheral vestibulopathy  PLAN:  - continue aspirin 81mg  daily + statin  - diabetes and lipid mgmt per PCP and endocrinology  - will check  sleep study (prior dx of mild sleep apnea, with worsening daytime fatigue, obesity, confusion and memory issues)   Please review patient information and submit instructions for scheduling and orders for sleep technologist.  Thank you!

## 2013-04-04 NOTE — Telephone Encounter (Signed)
Split study at [facility] 15 and 3% , co2 .

## 2013-04-05 ENCOUNTER — Ambulatory Visit
Admission: RE | Admit: 2013-04-05 | Discharge: 2013-04-05 | Disposition: A | Discharge status: Home / Self Care | Payer: 59 | Source: Ambulatory Visit | Attending: Internal Medicine | Admitting: Internal Medicine

## 2013-04-05 ENCOUNTER — Other Ambulatory Visit (HOSPITAL_COMMUNITY)
Admission: RE | Admit: 2013-04-05 | Discharge: 2013-04-05 | Disposition: A | Discharge status: Home / Self Care | Payer: 59 | Source: Ambulatory Visit | Attending: Interventional Radiology | Admitting: Interventional Radiology

## 2013-04-05 DIAGNOSIS — E041 Nontoxic single thyroid nodule: Secondary | ICD-10-CM

## 2013-04-05 DIAGNOSIS — E049 Nontoxic goiter, unspecified: Secondary | ICD-10-CM | POA: Insufficient documentation

## 2013-05-05 ENCOUNTER — Telehealth: Payer: Self-pay | Admitting: Diagnostic Neuroimaging

## 2013-05-05 NOTE — Telephone Encounter (Signed)
Patient was scheduled for a sleep study on 05/11/2013.  He calls the office today to cancel the sleep study only stating that he had no desire to proceed with it.  Advised the patient that I would inform the referring provider and cancel his appointment.

## 2013-06-01 ENCOUNTER — Encounter: Payer: Self-pay | Admitting: Interventional Cardiology

## 2013-06-14 ENCOUNTER — Telehealth: Payer: Self-pay | Admitting: Interventional Cardiology

## 2013-06-14 NOTE — Telephone Encounter (Signed)
returned pt call called pt home x2 and cell had no voicemail set up.uanble to lmom.

## 2013-06-14 NOTE — Telephone Encounter (Signed)
New Message  Pt requests a call back to discuss if the ofice visit scheduled for 07/05/2013 is needed. Pt states that he was advised that his cardiology appt's were no longer needed. Please call to clarify

## 2013-07-05 ENCOUNTER — Ambulatory Visit: Payer: 59 | Admitting: Interventional Cardiology

## 2013-09-28 ENCOUNTER — Other Ambulatory Visit: Payer: Self-pay | Admitting: Internal Medicine

## 2013-09-28 ENCOUNTER — Ambulatory Visit: Payer: 59 | Admitting: Diagnostic Neuroimaging

## 2013-09-28 DIAGNOSIS — E042 Nontoxic multinodular goiter: Secondary | ICD-10-CM

## 2013-11-23 ENCOUNTER — Encounter (HOSPITAL_COMMUNITY): Payer: Self-pay | Admitting: Emergency Medicine

## 2013-11-23 ENCOUNTER — Emergency Department (HOSPITAL_COMMUNITY)
Admission: EM | Admit: 2013-11-23 | Discharge: 2013-11-23 | Disposition: A | Discharge status: Home / Self Care | Payer: 59 | Attending: Emergency Medicine | Admitting: Emergency Medicine

## 2013-11-23 ENCOUNTER — Emergency Department (HOSPITAL_COMMUNITY): Payer: 59

## 2013-11-23 DIAGNOSIS — Z87891 Personal history of nicotine dependence: Secondary | ICD-10-CM | POA: Insufficient documentation

## 2013-11-23 DIAGNOSIS — Z79899 Other long term (current) drug therapy: Secondary | ICD-10-CM | POA: Diagnosis not present

## 2013-11-23 DIAGNOSIS — E559 Vitamin D deficiency, unspecified: Secondary | ICD-10-CM | POA: Insufficient documentation

## 2013-11-23 DIAGNOSIS — Z9861 Coronary angioplasty status: Secondary | ICD-10-CM | POA: Diagnosis not present

## 2013-11-23 DIAGNOSIS — R42 Dizziness and giddiness: Secondary | ICD-10-CM | POA: Diagnosis not present

## 2013-11-23 DIAGNOSIS — Z8719 Personal history of other diseases of the digestive system: Secondary | ICD-10-CM | POA: Diagnosis not present

## 2013-11-23 DIAGNOSIS — R001 Bradycardia, unspecified: Secondary | ICD-10-CM | POA: Insufficient documentation

## 2013-11-23 DIAGNOSIS — E782 Mixed hyperlipidemia: Secondary | ICD-10-CM | POA: Diagnosis not present

## 2013-11-23 DIAGNOSIS — Z8673 Personal history of transient ischemic attack (TIA), and cerebral infarction without residual deficits: Secondary | ICD-10-CM | POA: Diagnosis not present

## 2013-11-23 DIAGNOSIS — Z8669 Personal history of other diseases of the nervous system and sense organs: Secondary | ICD-10-CM | POA: Diagnosis not present

## 2013-11-23 DIAGNOSIS — Z8739 Personal history of other diseases of the musculoskeletal system and connective tissue: Secondary | ICD-10-CM | POA: Insufficient documentation

## 2013-11-23 DIAGNOSIS — I2510 Atherosclerotic heart disease of native coronary artery without angina pectoris: Secondary | ICD-10-CM | POA: Insufficient documentation

## 2013-11-23 DIAGNOSIS — Z7982 Long term (current) use of aspirin: Secondary | ICD-10-CM | POA: Insufficient documentation

## 2013-11-23 DIAGNOSIS — E669 Obesity, unspecified: Secondary | ICD-10-CM | POA: Insufficient documentation

## 2013-11-23 LAB — BASIC METABOLIC PANEL
Anion gap: 13 (ref 5–15)
BUN: 17 mg/dL (ref 6–23)
CO2: 24 mEq/L (ref 19–32)
Calcium: 9.3 mg/dL (ref 8.4–10.5)
Chloride: 101 mEq/L (ref 96–112)
Creatinine, Ser: 1.07 mg/dL (ref 0.50–1.35)
GFR calc Af Amer: 85 mL/min — ABNORMAL LOW (ref >90)
GFR calc non Af Amer: 74 mL/min — ABNORMAL LOW (ref >90)
Glucose, Bld: 132 mg/dL — ABNORMAL HIGH (ref 70–99)
Potassium: 4.2 mEq/L (ref 3.7–5.3)
Sodium: 138 mEq/L (ref 137–147)

## 2013-11-23 LAB — CBC WITH DIFFERENTIAL/PLATELET
Basophils Absolute: 0 10*3/uL (ref 0.0–0.1)
Basophils Relative: 1 % (ref 0–1)
Eosinophils Absolute: 0.1 10*3/uL (ref 0.0–0.7)
Eosinophils Relative: 3 % (ref 0–5)
HCT: 39.7 % (ref 39.0–52.0)
Hemoglobin: 13.8 g/dL (ref 13.0–17.0)
Lymphocytes Relative: 24 % (ref 12–46)
Lymphs Abs: 1.4 10*3/uL (ref 0.7–4.0)
MCH: 30.4 pg (ref 26.0–34.0)
MCHC: 34.8 g/dL (ref 30.0–36.0)
MCV: 87.4 fL (ref 78.0–100.0)
Monocytes Absolute: 0.4 10*3/uL (ref 0.1–1.0)
Monocytes Relative: 8 % (ref 3–12)
Neutro Abs: 3.7 10*3/uL (ref 1.7–7.7)
Neutrophils Relative %: 64 % (ref 43–77)
Platelets: 218 10*3/uL (ref 150–400)
RBC: 4.54 MIL/uL (ref 4.22–5.81)
RDW: 13.7 % (ref 11.5–15.5)
WBC: 5.7 10*3/uL (ref 4.0–10.5)

## 2013-11-23 LAB — I-STAT TROPONIN, ED: Troponin i, poc: 0 ng/mL (ref 0.00–0.08)

## 2013-11-23 MED ORDER — SODIUM CHLORIDE 0.9 % IV BOLUS (SEPSIS)
1000.0000 mL | Freq: Once | INTRAVENOUS | Status: AC
  Administered 2013-11-23: 1000 mL via INTRAVENOUS

## 2013-11-23 NOTE — ED Provider Notes (Signed)
CSN: [id number]     Arrival date & time 11/23/13  1011 History   First MD Initiated Contact with Patient 11/23/13 1043     Chief Complaint  Patient presents with  . Dizziness  . Bradycardia     (Consider location/radiation/quality/duration/timing/severity/associated sxs/prior Treatment) HPI  Jesus Stone is a 58 y.o.male with a significant PMH of headache, crohn's disease, CAD, sleep apnea, obesity, headaches presents to the ER with complaints of blurry vision, tingling sensations to the face and not feeling "right". He has been having sinus and cold symptoms recently and this morning took his Delsym cough 12 hour medication and two Mucinex tablets. He took 2 x 10 ml cups of Delsym (recommended Max Dose is 10 ml every 12 hrs) and has been doing this twice a day for the past 4 days. He went from sitting to standing and had bilateral tingling and numbness around his mouth and on his cheeks. Also at the tips of his fingers. He also felt has though both of his eyes are going in and out of focus. Worsens when he moves his hands around. He is continuing to have symptoms. No neurological symptoms. A&O x 3.    Past Medical History  Diagnosis Date  . Headache(784.0)   . Mixed hyperlipidemia   . Nausea alone   . Visual discomfort   . Disturbance of skin sensation   . CAD (coronary artery disease)   . Crohn's disease   . Sleep apnea   . Obesity   . Anal polyp   . Bell's palsy 2009  . Thyroid nodule     left  . TIA (transient ischemic attack) 06/2011  . Acute sinusitis, unspecified   . Gout, unspecified   . Unspecified vitamin D deficiency   . Coronary atherosclerosis of unspecified type of vessel, native or graft   . Regional enteritis of unspecified site   . Periodic limb movement disorder   . Nontoxic multinodular goiter    Past Surgical History  Procedure Laterality Date  . Appendectomy  1964  . Testicle surgery  1967  . Coronary artery stent placment  03/1998  . Rectal  polypectomy  2008, 2009  . Heel spur excision Right 2008   Family History  Problem Relation Age of Onset  . Cancer Mother   . Diabetes Sister   . Cancer Maternal Grandmother   . Cancer Paternal Aunt    History  Substance Use Topics  . Smoking status: Former Smoker    Quit date: 03/01/1984  . Smokeless tobacco: Not on file  . Alcohol Use: No    Review of Systems  All other systems reviewed and are negative.     Allergies  Lipitor and Simvastatin  Home Medications   Prior to Admission medications   Medication Sig Start Date End Date Taking? Authorizing Provider  aspirin EC 325 MG tablet Take 325 mg by mouth 2 (two) times daily.   Yes Historical Provider, MD  Cholecalciferol (VITAMIN D3) 2000 UNITS TABS Take 2 capsules by mouth 2 (two) times daily.    Yes Historical Provider, MD  DELZICOL 400 MG CPDR DR capsule Take 1,200 capsules by mouth 2 (two) times daily.  02/12/13  Yes Historical Provider, MD  mercaptopurine (PURINETHOL) 50 MG tablet Take 1 tablet by mouth daily. 01/22/13  Yes Historical Provider, MD  metoprolol tartrate (LOPRESSOR) 25 MG tablet Take 25 mg by mouth 2 (two) times daily.  02/12/13  Yes Historical Provider, MD  Omega-3 1000 MG CAPS Take  1 capsule by mouth 2 (two) times daily.    Yes Historical Provider, MD  rosuvastatin (CRESTOR) 20 MG tablet Take 20 mg by mouth daily.   Yes Historical Provider, MD  TRICOR 145 MG tablet Take 1 tablet by mouth daily. 02/12/13  Yes Historical Provider, MD  valsartan-hydrochlorothiazide (DIOVAN-HCT) 160-12.5 MG per tablet Take 1 tablet by mouth daily. 02/13/13  Yes Historical Provider, MD   BP 167/85  Pulse 51  Temp(Src) 98.4 F (36.9 C) (Oral)  Resp 18  SpO2 98% Physical Exam  Nursing note and vitals reviewed. Constitutional: He is oriented to person, place, and time. He appears well-developed and well-nourished. No distress.  HENT:  Head: Normocephalic and atraumatic.  Eyes: Pupils are equal, round, and reactive to  light.  Neck: Normal range of motion. Neck supple. No spinous process tenderness present. Normal range of motion present.  Cardiovascular: Normal rate and regular rhythm.   Pulmonary/Chest: Effort normal and breath sounds normal. He exhibits no tenderness, no bony tenderness and no laceration.  Abdominal: Soft.  Musculoskeletal:  No lower extremity swelling  Neurological: He is alert and oriented to person, place, and time. He has normal strength. No cranial nerve deficit or sensory deficit. GCS eye subscore is 4. GCS verbal subscore is 5. GCS motor subscore is 6.  Dizziness worsened with moving his head side to side  Skin: Skin is warm and dry.   ED Course  Procedures (including critical care time) Labs Review Labs Reviewed  BASIC METABOLIC PANEL - Abnormal; Notable for the following:    Glucose, Bld 132 (*)    GFR calc non Af Amer 74 (*)    GFR calc Af Amer 85 (*)    All other components within normal limits  CBC WITH DIFFERENTIAL  Randolm Idol, ED    Imaging Review Dg Chest 2 View  11/23/2013   CLINICAL DATA:  Dizziness with bradycardia. Initial encounter.  EXAM: CHEST  2 VIEW  COMPARISON:  05/05/2006.  FINDINGS: The heart size and mediastinal contours are within normal limits. Both lungs are clear. The visualized skeletal structures are unremarkable.  IMPRESSION: No active cardiopulmonary disease.   Electronically Signed   By: Rolla Flatten M.D.   On: 11/23/2013 11:46     EKG Interpretation None      MDM   Final diagnoses:  Polypharmacy    Pt is well appearing. He has no neurological deficits on physical exam. Discussed case with Dr. Venora Maples, pt has taken twice the recommended dose of Delsym as well as two guaifenesin tablets. We believe his symptoms are due to this. Will given fluids in the ED and monitor. NOrmal EKG, neg trop, cbc and bmp show no acute findings and chest xray is reassuring  Pt received IV fluids in the ED. Has remained table.  12:50 pm Dr. Venora Maples  has seen patient as well and agrees with my diagnosis. Will refer back to his PCP and have given advice on paying close attention to medication dates/dosages.  58 y.o.Jesus Stone's evaluation in the Emergency Department is complete. It has been determined that no acute conditions requiring further emergency intervention are present at this time. The patient/guardian have been advised of the diagnosis and plan. We have discussed signs and symptoms that warrant return to the ED, such as changes or worsening in symptoms.  Vital signs are stable at discharge. Filed Vitals:   11/23/13 1214  BP: 154/77  Pulse: 56  Temp:   Resp:     Patient/guardian has  voiced understanding and agreed to follow-up with the PCP or specialist.    Linus Mako, PA-C 11/23/13 1251

## 2013-11-23 NOTE — ED Notes (Signed)
Pt from work c/o tingling around mouth and nose; pt sts some dizziness and nausea upon standing; pt sts some heaviness of arms and legs and feels off balance; pt noted to be brady and takes beta blockers; discussed findings with EDP

## 2013-11-23 NOTE — ED Provider Notes (Signed)
ECG interpretation   Date: 11/23/2013  Rate: 55  Rhythm: sinus bradycardia  QRS Axis: normal  Intervals: normal  ST/T Wave abnormalities: normal  Conduction Disutrbances: none  Narrative Interpretation:   Old EKG Reviewed: no prior ecg     Hoy Morn, MD 11/23/13 1106

## 2013-11-23 NOTE — Discharge Instructions (Signed)
Basics of Medication Management  UNDERSTAND YOUR MEDICATIONS   Read all of the labels and inserts that come with your medications. Review the information on this form often.   Know what potential side effects to look for (for each medication).   Know what each of your medications look like (by color, shape, size, stamp). If you are getting confused and having a hard time telling them apart, talk with your caregiver or pharmacist. They may be able to change the medication or help you to identify them more easily.   Check with your pharmacist if you notice a difference in the size or color of your medication.   Get all of your medications at one pharmacy. The pharmacist will have all of your information and understand possible drug interactions.   Ask your caregiver questions about your prescriptions and any over-the-counter medications, vitamins, herbal or dietary supplements that you take.  TAKE YOUR MEDICATION SAFELY   Take medications only as prescribed.   Talk with your caregiver or pharmacist if some of your pills look the same and it is difficult to tell them apart. They can help you to recognize different medications.   Never double up on your medication.   Never take anyone else's medication or share your medications.   Do not stop taking your medication(s) unless you have discussed it with your caregiver.   Do not split, mash, or chew medications unless your caregiver tells you to do so. Tell your caregiver if you have trouble swallowing your medication(s).   For liquid medications, make sure you use the dosing container provided to you.   You may need to avoid alcohol or certain foods or liquids with one or more of your medications. Make sure you remember how to take each medication with some of the tools below.  ORGANIZE YOUR MEDICATIONS   Use a tool, such as a weekly pill box (available at your local pharmacy), written chart from your caregiver, notebook, binder, or your own calendar to  organize your daily medications. Please note: if you are having trouble telling your different medications apart, keep them in the original bottles.   Set cues or reminders for taking your medications. Use watch alarms, mobile device/phone calendar alarms, or sticky notes.   More advanced medication management systems are also available. These offer weekly or monthly options complete with storage, alarms, and visual and audio prompts.   Your system should help you to keep track of the:   Name of medication and dose.   Day.   Time.   Pill to take (by color, shape, size, or name/imprint).   How to take it (with or without certain foods, on an empty stomach, with fluids etc.).   Review your medication schedule with a family member or friend to help you. Other household members should understand your medications.   If you are taking medications on an "as needed" basis such as those for nausea or pain, write down the name, dose, and time you took the medication so that you remember what you have taken.  PLAN AHEAD FOR REFILLS AND TRAVEL   Take your pill box, medications, and calendar system with you when you travel.   Plan ahead for refills as to not run out of your medication(s).   Always carry an updated list of your medications with you. If there is an emergency, a respondent can quickly see what medications you are taking.  STORE AND DISCARD YOUR MEDICATIONS SAFELY   Store medications in   a cool, dry area away from light. (The bathroom is not a good place for storage because of heat and humidity.)   Store your medications away from chemicals, pet medications, or other family member's medications.   Keep medications out of children's reach, away from counters and bedside tables. Store them up high in cabinets or shelves.   Check expiration dates regularly.   Learn about the best way to dispose of each medication you take. Find out if your local government recycling program has a Medicine Take Back  program for safe disposal. If not, some medications may be mixed with inedible substances and thrown away in the trash. Certain medications are to be flushed down the toilet.  REMEMBER:   Tell your caregiver if you experience side effects, new symptoms, or have other concerns. There may be dosing changes or alternative medications that would be better for you.   Review your medications regularly with your caregiver. Check to see if you need to continue to take each medication, and discuss how well they are working. Medicines, diet, medical conditions, weight changes, and other habits can all affect how medicines work.  PEDIATRIC CONSIDERATIONS  If you are taking care of an infant or child who needs multiple medications, follow the tips above to organize a medication schedule and safely give and store medications.    Use positive reinforcement (singing, cuddling, reward) for your child to help him or her take necessary medications.   Use only syringes, droppers, dosing spoons, or dosing cups provided by your caregiver or pharmacist.   Always wash your hands before giving medications.   Get to know your child's school medication policies. Meet with the school nurse to review the medication schedule in detail. Never send the medication to school with your child.   Check with your child's caregiver if he or she has trouble taking medication, forgets a dose, or spits it up.   Make sure your child knows how to use an inhaler properly if needed.   Do not give your child over-the-counter cough and cold medicines if they are under 2 years of age.   Avoid giving your child or teenager Aspirin or Aspirin-containing products.  Document Released: 05/06/2010 Document Revised: 04/13/2011 Document Reviewed: 05/06/2010  [employer] Patient Information 2015 [employer]. This information is not intended to replace advice given to you by your health care provider. Make sure you discuss any questions you have with your  health care provider.

## 2013-11-23 NOTE — ED Notes (Signed)
Patient is alert and orientedx4.  Patient was explained discharge instructions and they understood them with no questions.   

## 2013-11-23 NOTE — ED Provider Notes (Signed)
Medical screening examination/treatment/procedure(s) were conducted as a shared visit with non-physician practitioner(s) and myself.  I personally evaluated the patient during the encounter.  ECG: SEE MY OTHER NOTE  Feels better.  Overall well-appearing.  Likely polypharmacy with the over-the-counter medications.  Discharge home in good condition.  Ambulatory.  PCP followup  Hoy Morn, MD 11/23/13 [phone]

## 2013-11-23 NOTE — ED Notes (Signed)
Patient transported to X-ray 

## 2013-11-23 NOTE — ED Notes (Signed)
Pt states he woke up this am and was feeling his normal. Around 9a while at work he stood up to go to a meeting and had sudden onset of dizziness, nausea, blurred vision, tingling to lips, pressure in head, and a heaviness to arms. Pt denies chest pain or sob associated. At this time patient states he just feels "slow." denies any nausea, numbness or tingling to lips, reports slight pressure feeling in head. Pt states his symptoms lasted approx 1 hour.

## 2013-11-27 ENCOUNTER — Ambulatory Visit
Admission: RE | Admit: 2013-11-27 | Discharge: 2013-11-27 | Disposition: A | Discharge status: Home / Self Care | Payer: 59 | Source: Ambulatory Visit | Attending: Internal Medicine | Admitting: Internal Medicine

## 2013-11-27 DIAGNOSIS — E042 Nontoxic multinodular goiter: Secondary | ICD-10-CM

## 2014-02-20 ENCOUNTER — Other Ambulatory Visit: Payer: Self-pay | Admitting: Dermatology

## 2014-04-04 ENCOUNTER — Other Ambulatory Visit: Payer: Self-pay | Admitting: Dermatology

## 2014-04-04 DIAGNOSIS — C4492 Squamous cell carcinoma of skin, unspecified: Secondary | ICD-10-CM

## 2014-04-04 HISTORY — DX: Squamous cell carcinoma of skin, unspecified: C44.92

## 2014-05-17 ENCOUNTER — Other Ambulatory Visit: Payer: Self-pay | Admitting: Dermatology

## 2014-07-24 IMAGING — US US SOFT TISSUE HEAD/NECK
1 series · 13 of 25 positions shown · non-contrast
Comparison: US SOFT TISSUE HEAD/NECK dated 04/05/2012; US SOFT TISSUE
HEAD/NECK dated 10/01/2011; US THYROID BIOPSY dated 10/01/2011

CLINICAL DATA: Multinodular goiter

EXAM:
THYROID ULTRASOUND
TECHNIQUE: Ultrasound examination of the thyroid gland and adjacent soft
tissues was performed.

[Series 1: us soft tissue head/neck · 0.08mm/px · 13 of 37 slices shown]
[im 1/37]
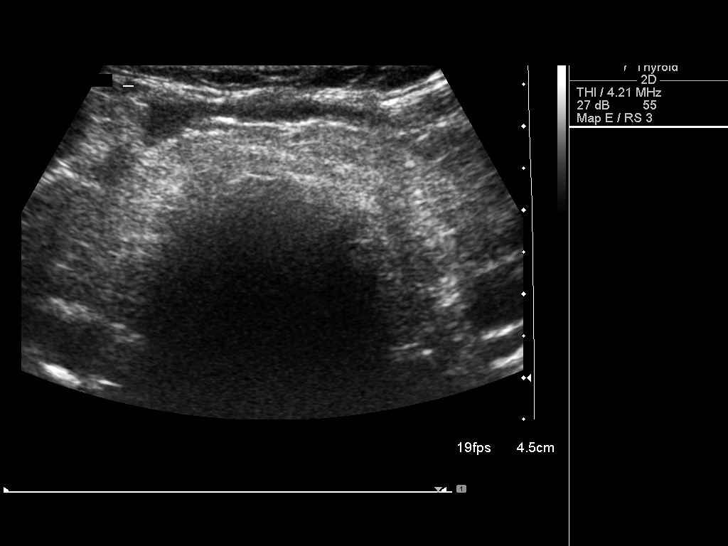
[im 4/37]
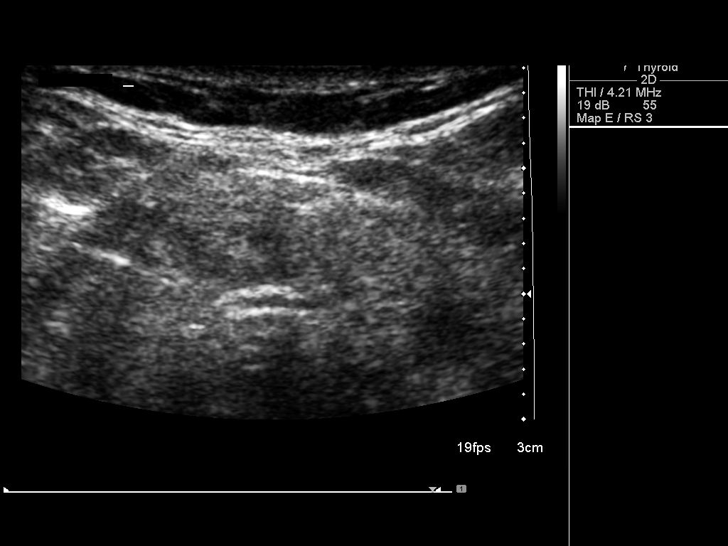
[im 7/37]
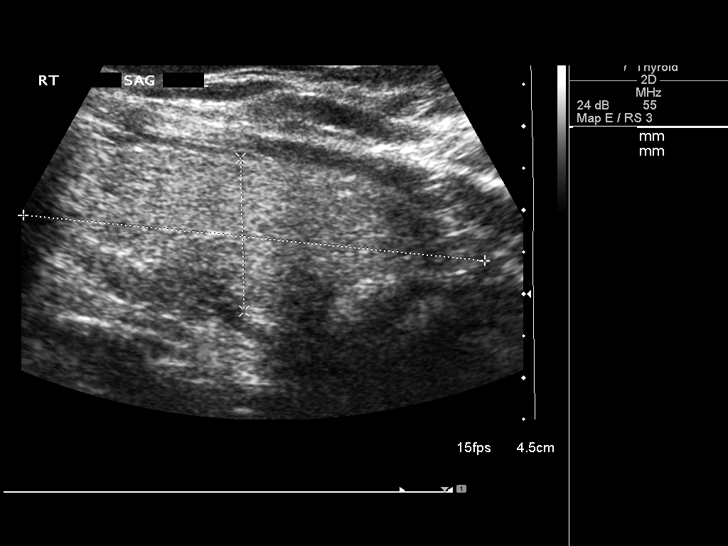
[im 10/37]
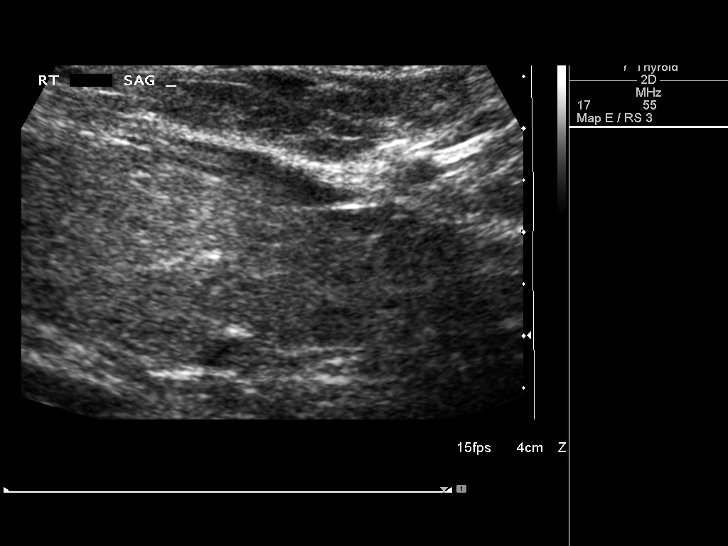
[im 13/37]
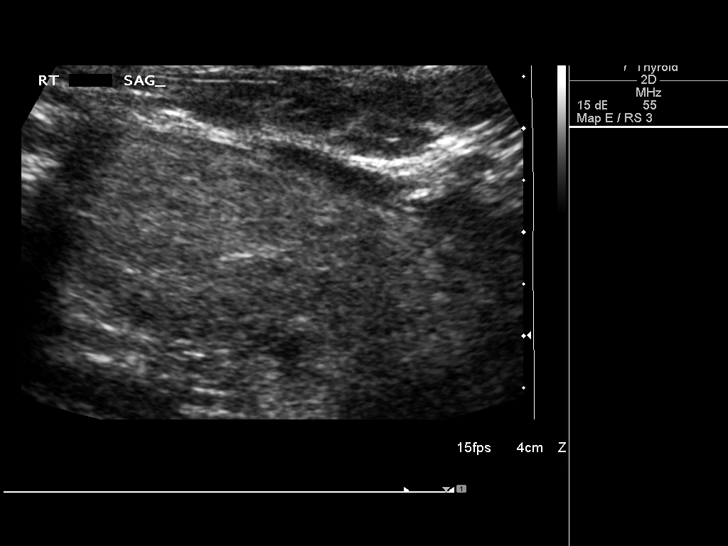
[im 16/37]
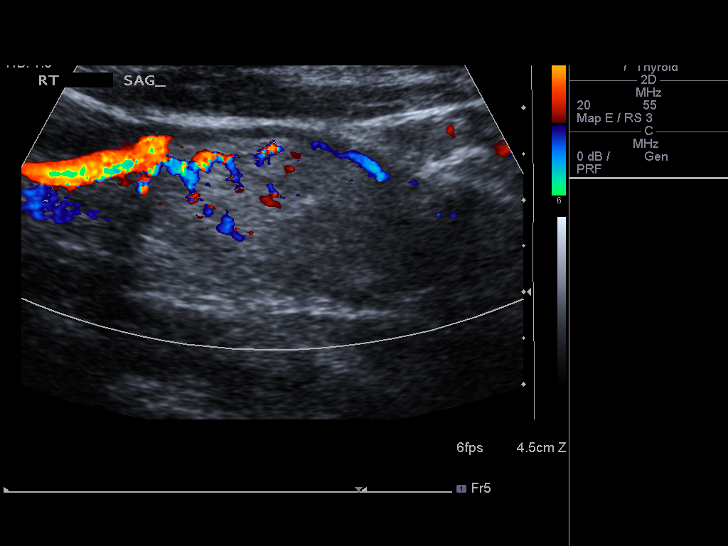
[im 19/37]
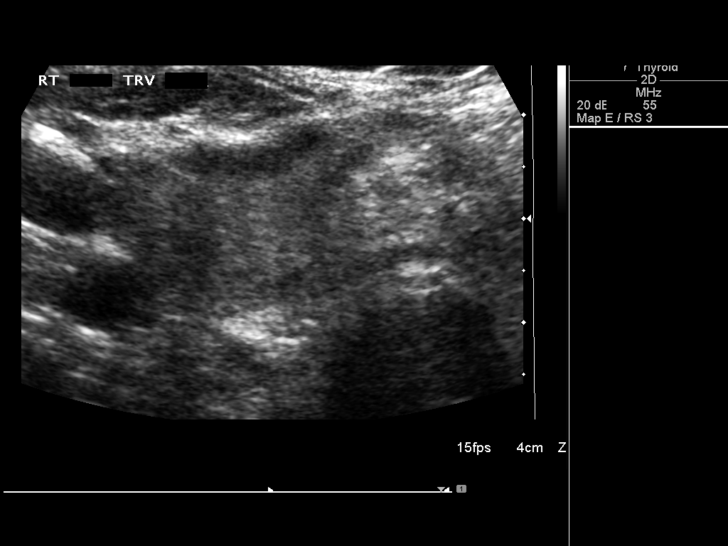
[im 22/37]
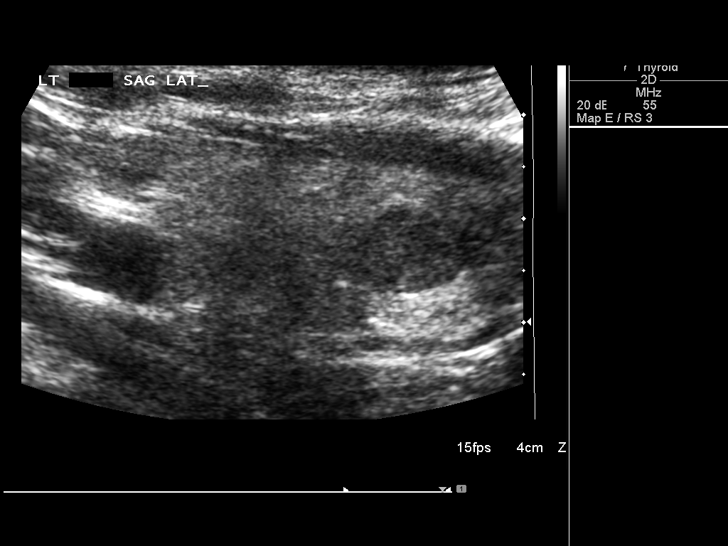
[im 25/37]
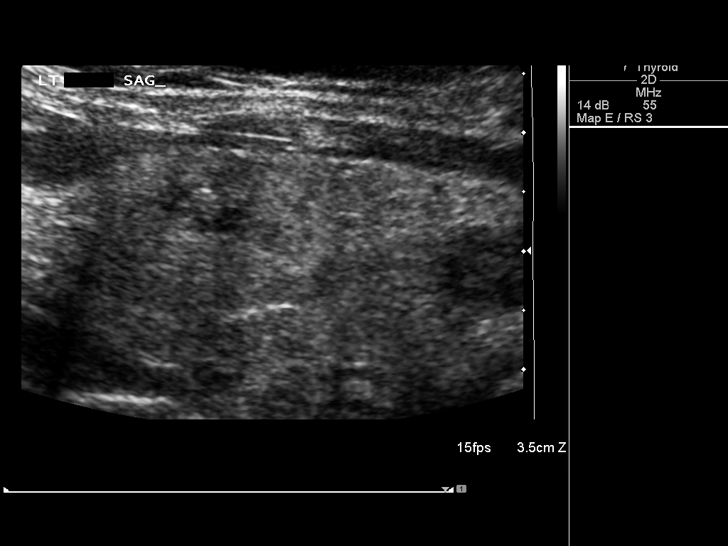
[im 28/37]
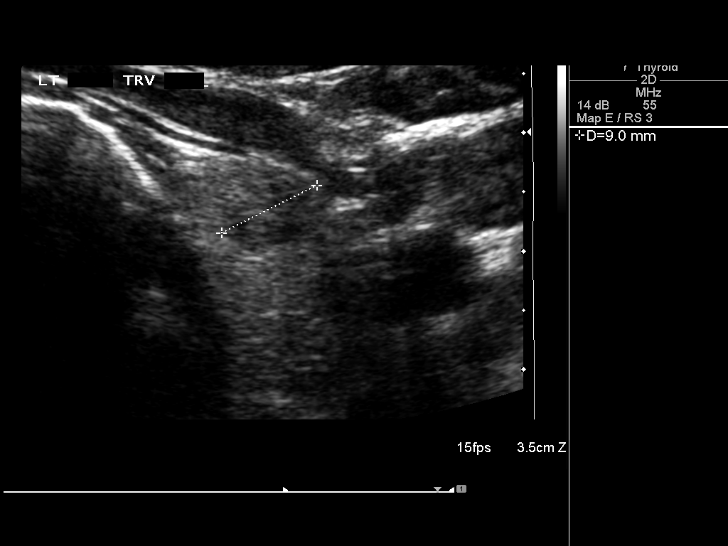
[im 31/37]
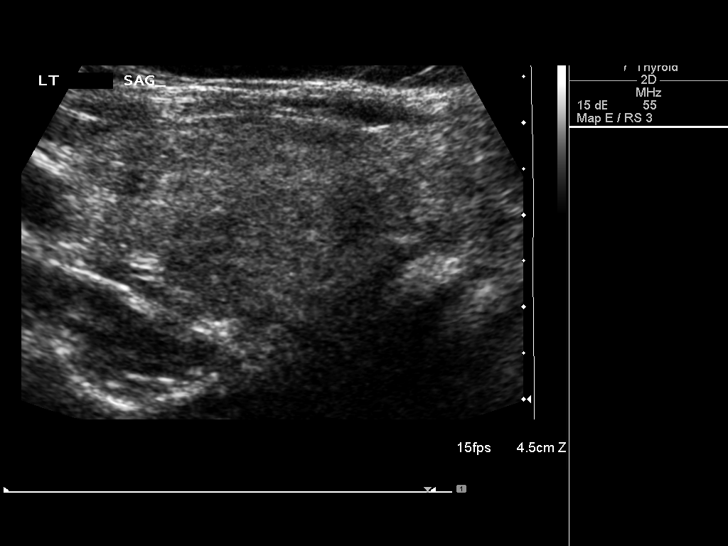
[im 34/37]
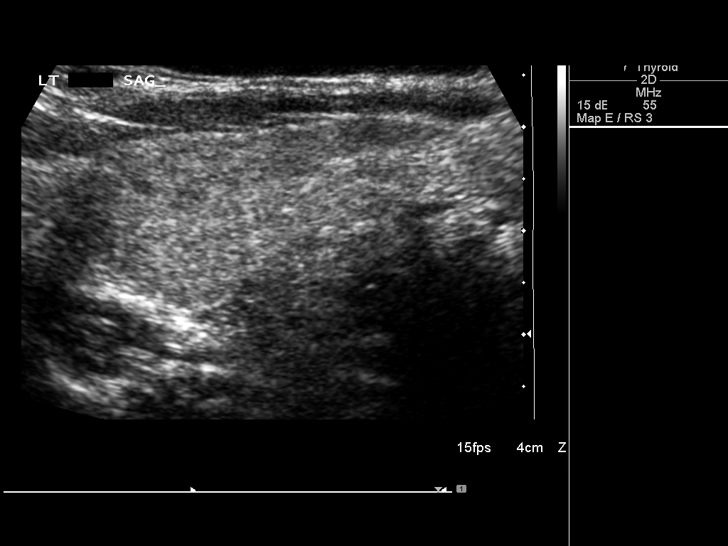
[im 37/37]
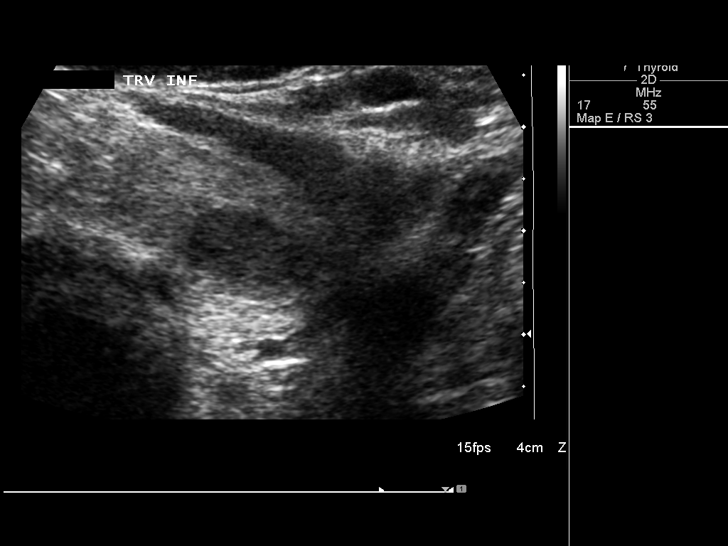

[13 of 25 positions shown; findings below may reference images not displayed]

FINDINGS: There is diffuse heterogeneity of the thyroid parenchymal
echotexture. No new discrete thyroid nodules.

Right thyroid lobe

Measurements: Normal in size measuring 5.5 x 1.8 x 2.2 cm.

Right, inferior, medial - 0.7 x 0.6 x 0.6 cm - slightly hypoechoic,
solid, unchanged

Left thyroid lobe

Measurements: Normal in size measuring 5.6 x 2.6 x 2.1 cm.

Left, superior - 1.1 x 1.0 x 0.9 cm - mixed echogenic, solid,
unchanged, previously, 2.2 x 0.9 x 1.1 cm - this nodule was
previously biopsied.

Left, inferior - 1.3 x 0.9 x 1.2 cm- slightly hypoechoic, solid,
unchanged, previously, 1.2 x 0.8 x 1.1 cm.

Isthmus

Thickness: Normal in size measuring 0.9 cm in diameter.

Inferior - 1.6 x 2.2 x 1.3 cm - mixed echogenic, solid, grossly
unchanged, previously, 1.4 x 2.2 x 1.2 cm -

Lymphadenopathy

None visualized.
IMPRESSION: Overall findings again compatible with multi nodular goiter.
Dominant approximately 2.2 cm nodule within the thyroid isthmus is
grossly unchanged since the [DATE] examination, though has enlarged
when compared to the [DATE] examination. Continued attention on
follow-up is recommended. Additional bilateral thyroid nodules,
including the previously biopsied nodule within the superior aspect
of the left lobe of the thyroid are unchanged to minimally decreased
in size in the interval.

## 2015-04-25 ENCOUNTER — Emergency Department (HOSPITAL_COMMUNITY): Payer: 59

## 2015-04-25 ENCOUNTER — Encounter (HOSPITAL_COMMUNITY): Payer: Self-pay | Admitting: Emergency Medicine

## 2015-04-25 ENCOUNTER — Inpatient Hospital Stay (HOSPITAL_COMMUNITY)
Admission: EM | Admit: 2015-04-25 | Discharge: 2015-04-27 | DRG: 247 | Disposition: A | Discharge status: Home / Self Care | Payer: 59 | Attending: Internal Medicine | Admitting: Internal Medicine

## 2015-04-25 DIAGNOSIS — E785 Hyperlipidemia, unspecified: Secondary | ICD-10-CM

## 2015-04-25 DIAGNOSIS — R079 Chest pain, unspecified: Secondary | ICD-10-CM

## 2015-04-25 DIAGNOSIS — Z888 Allergy status to other drugs, medicaments and biological substances status: Secondary | ICD-10-CM

## 2015-04-25 DIAGNOSIS — Z79899 Other long term (current) drug therapy: Secondary | ICD-10-CM

## 2015-04-25 DIAGNOSIS — Z6833 Body mass index (BMI) 33.0-33.9, adult: Secondary | ICD-10-CM

## 2015-04-25 DIAGNOSIS — I25110 Atherosclerotic heart disease of native coronary artery with unstable angina pectoris: Secondary | ICD-10-CM | POA: Diagnosis present

## 2015-04-25 DIAGNOSIS — T82855A Stenosis of coronary artery stent, initial encounter: Secondary | ICD-10-CM | POA: Diagnosis present

## 2015-04-25 DIAGNOSIS — Z87891 Personal history of nicotine dependence: Secondary | ICD-10-CM

## 2015-04-25 DIAGNOSIS — Z7984 Long term (current) use of oral hypoglycemic drugs: Secondary | ICD-10-CM | POA: Diagnosis not present

## 2015-04-25 DIAGNOSIS — E1159 Type 2 diabetes mellitus with other circulatory complications: Secondary | ICD-10-CM | POA: Diagnosis not present

## 2015-04-25 DIAGNOSIS — Z7982 Long term (current) use of aspirin: Secondary | ICD-10-CM | POA: Diagnosis not present

## 2015-04-25 DIAGNOSIS — I200 Unstable angina: Secondary | ICD-10-CM | POA: Diagnosis not present

## 2015-04-25 DIAGNOSIS — G4733 Obstructive sleep apnea (adult) (pediatric): Secondary | ICD-10-CM | POA: Diagnosis present

## 2015-04-25 DIAGNOSIS — K5090 Crohn's disease, unspecified, without complications: Secondary | ICD-10-CM | POA: Diagnosis present

## 2015-04-25 DIAGNOSIS — Z8673 Personal history of transient ischemic attack (TIA), and cerebral infarction without residual deficits: Secondary | ICD-10-CM

## 2015-04-25 DIAGNOSIS — I10 Essential (primary) hypertension: Secondary | ICD-10-CM | POA: Diagnosis present

## 2015-04-25 DIAGNOSIS — E782 Mixed hyperlipidemia: Secondary | ICD-10-CM | POA: Diagnosis present

## 2015-04-25 DIAGNOSIS — Z955 Presence of coronary angioplasty implant and graft: Secondary | ICD-10-CM

## 2015-04-25 DIAGNOSIS — E669 Obesity, unspecified: Secondary | ICD-10-CM | POA: Diagnosis present

## 2015-04-25 DIAGNOSIS — I25119 Atherosclerotic heart disease of native coronary artery with unspecified angina pectoris: Secondary | ICD-10-CM | POA: Diagnosis present

## 2015-04-25 DIAGNOSIS — E119 Type 2 diabetes mellitus without complications: Secondary | ICD-10-CM | POA: Diagnosis present

## 2015-04-25 DIAGNOSIS — G459 Transient cerebral ischemic attack, unspecified: Secondary | ICD-10-CM | POA: Diagnosis present

## 2015-04-25 DIAGNOSIS — G4730 Sleep apnea, unspecified: Secondary | ICD-10-CM | POA: Diagnosis present

## 2015-04-25 HISTORY — DX: Hyperlipidemia, unspecified: E78.5

## 2015-04-25 HISTORY — DX: Essential (primary) hypertension: I10

## 2015-04-25 HISTORY — DX: Type 2 diabetes mellitus without complications: E11.9

## 2015-04-25 LAB — BASIC METABOLIC PANEL
Anion gap: 12 (ref 5–15)
BUN: 23 mg/dL — ABNORMAL HIGH (ref 6–20)
CO2: 26 mmol/L (ref 22–32)
Calcium: 9.4 mg/dL (ref 8.9–10.3)
Chloride: 102 mmol/L (ref 101–111)
Creatinine, Ser: 1.28 mg/dL — ABNORMAL HIGH (ref 0.61–1.24)
GFR calc Af Amer: >60 mL/min (ref >60)
GFR calc non Af Amer: 58 mL/min — ABNORMAL LOW (ref >60)
Glucose, Bld: 291 mg/dL — ABNORMAL HIGH (ref 65–99)
Potassium: 4 mmol/L (ref 3.5–5.1)
Sodium: 140 mmol/L (ref 135–145)

## 2015-04-25 LAB — CREATININE, SERUM
Creatinine, Ser: 1.16 mg/dL (ref 0.61–1.24)
GFR calc Af Amer: >60 mL/min (ref >60)
GFR calc non Af Amer: >60 mL/min (ref >60)

## 2015-04-25 LAB — CBC
HCT: 39.8 % (ref 39.0–52.0)
HCT: 37.1 % — ABNORMAL LOW (ref 39.0–52.0)
Hemoglobin: 13.7 g/dL (ref 13.0–17.0)
Hemoglobin: 12.5 g/dL — ABNORMAL LOW (ref 13.0–17.0)
MCH: 29.1 pg (ref 26.0–34.0)
MCH: 29.1 pg (ref 26.0–34.0)
MCHC: 34.4 g/dL (ref 30.0–36.0)
MCHC: 33.7 g/dL (ref 30.0–36.0)
MCV: 84.7 fL (ref 78.0–100.0)
MCV: 86.5 fL (ref 78.0–100.0)
Platelets: 243 10*3/uL (ref 150–400)
Platelets: 215 10*3/uL (ref 150–400)
RBC: 4.7 MIL/uL (ref 4.22–5.81)
RBC: 4.29 MIL/uL (ref 4.22–5.81)
RDW: 13.5 % (ref 11.5–15.5)
RDW: 13.6 % (ref 11.5–15.5)
WBC: 6.1 10*3/uL (ref 4.0–10.5)
WBC: 5.9 10*3/uL (ref 4.0–10.5)

## 2015-04-25 LAB — CBG MONITORING, ED: Glucose-Capillary: 152 mg/dL — ABNORMAL HIGH (ref 65–99)

## 2015-04-25 LAB — TROPONIN I: Troponin I: <0.03 ng/mL (ref <0.031)

## 2015-04-25 LAB — I-STAT TROPONIN, ED: Troponin i, poc: 0.01 ng/mL (ref 0.00–0.08)

## 2015-04-25 MED ORDER — MERCAPTOPURINE 50 MG PO TABS: 75.0000 mg | ORAL_TABLET | Freq: Every day | ORAL | Status: DC

## 2015-04-25 MED ORDER — ALPRAZOLAM 0.25 MG PO TABS: 0.2500 mg | ORAL_TABLET | Freq: Two times a day (BID) | ORAL | Status: DC | PRN

## 2015-04-25 MED ORDER — NITROGLYCERIN 0.4 MG SL SUBL: 0.4000 mg | SUBLINGUAL_TABLET | SUBLINGUAL | Status: DC | PRN

## 2015-04-25 MED ORDER — MESALAMINE 1.2 G PO TBEC
1.2000 g | DELAYED_RELEASE_TABLET | Freq: Every day | ORAL | Status: DC
  Administered 2015-04-26 – 2015-04-27 (×2): 1.2 g via ORAL
  Filled 2015-04-25 (×3): qty 1

## 2015-04-25 MED ORDER — HYDROCHLOROTHIAZIDE 12.5 MG PO CAPS
12.5000 mg | ORAL_CAPSULE | Freq: Every day | ORAL | Status: DC
  Administered 2015-04-26 – 2015-04-27 (×2): 12.5 mg via ORAL
  Filled 2015-04-25 (×2): qty 1

## 2015-04-25 MED ORDER — ASPIRIN 81 MG PO CHEW: 324.0000 mg | CHEWABLE_TABLET | Freq: Once | ORAL | Status: DC

## 2015-04-25 MED ORDER — ONDANSETRON HCL 4 MG/2ML IJ SOLN: 4.0000 mg | Freq: Four times a day (QID) | INTRAMUSCULAR | Status: DC | PRN

## 2015-04-25 MED ORDER — ZOLPIDEM TARTRATE 5 MG PO TABS: 5.0000 mg | ORAL_TABLET | Freq: Every evening | ORAL | Status: DC | PRN

## 2015-04-25 MED ORDER — VALSARTAN-HYDROCHLOROTHIAZIDE 160-12.5 MG PO TABS: 1.0000 | ORAL_TABLET | Freq: Every day | ORAL | Status: DC

## 2015-04-25 MED ORDER — ASPIRIN 81 MG PO CHEW
324.0000 mg | CHEWABLE_TABLET | ORAL | Status: AC
  Administered 2015-04-25: 324 mg via ORAL
  Filled 2015-04-25: qty 4

## 2015-04-25 MED ORDER — ACETAMINOPHEN 325 MG PO TABS
650.0000 mg | ORAL_TABLET | ORAL | Status: DC | PRN
  Administered 2015-04-26: 650 mg via ORAL
  Filled 2015-04-25: qty 2

## 2015-04-25 MED ORDER — NITROGLYCERIN 2 % TD OINT
0.5000 [in_us] | TOPICAL_OINTMENT | Freq: Four times a day (QID) | TRANSDERMAL | Status: DC
  Administered 2015-04-25: 0.5 [in_us] via TOPICAL
  Filled 2015-04-25: qty 1

## 2015-04-25 MED ORDER — INSULIN ASPART 100 UNIT/ML ~~LOC~~ SOLN
0.0000 [IU] | Freq: Three times a day (TID) | SUBCUTANEOUS | Status: DC
  Administered 2015-04-26 (×2): 3 [IU] via SUBCUTANEOUS
  Administered 2015-04-26: 18:00:00 5 [IU] via SUBCUTANEOUS
  Administered 2015-04-27: 07:00:00 3 [IU] via SUBCUTANEOUS

## 2015-04-25 MED ORDER — METOPROLOL TARTRATE 25 MG PO TABS
25.0000 mg | ORAL_TABLET | Freq: Two times a day (BID) | ORAL | Status: DC
  Administered 2015-04-25 – 2015-04-27 (×4): 25 mg via ORAL
  Filled 2015-04-25 (×4): qty 1

## 2015-04-25 MED ORDER — ASPIRIN 300 MG RE SUPP: 300.0000 mg | RECTAL | Status: AC

## 2015-04-25 MED ORDER — HEPARIN SODIUM (PORCINE) 5000 UNIT/ML IJ SOLN
5000.0000 [IU] | Freq: Three times a day (TID) | INTRAMUSCULAR | Status: DC
  Administered 2015-04-25 – 2015-04-26 (×2): 5000 [IU] via SUBCUTANEOUS
  Filled 2015-04-25 (×3): qty 1

## 2015-04-25 MED ORDER — ROSUVASTATIN CALCIUM 20 MG PO TABS
20.0000 mg | ORAL_TABLET | Freq: Every day | ORAL | Status: DC
  Administered 2015-04-26 – 2015-04-27 (×2): 20 mg via ORAL
  Filled 2015-04-25 (×2): qty 1

## 2015-04-25 MED ORDER — INSULIN ASPART 100 UNIT/ML ~~LOC~~ SOLN: 0.0000 [IU] | Freq: Every day | SUBCUTANEOUS | Status: DC

## 2015-04-25 MED ORDER — IRBESARTAN 75 MG PO TABS
150.0000 mg | ORAL_TABLET | Freq: Every day | ORAL | Status: DC
  Administered 2015-04-26 – 2015-04-27 (×2): 150 mg via ORAL
  Filled 2015-04-25: qty 1
  Filled 2015-04-25: qty 2

## 2015-04-25 MED ORDER — ASPIRIN EC 81 MG PO TBEC: 81.0000 mg | DELAYED_RELEASE_TABLET | Freq: Every day | ORAL | Status: DC

## 2015-04-25 MED ORDER — FENOFIBRATE 145 MG PO TABS
145.0000 mg | ORAL_TABLET | Freq: Every day | ORAL | Status: DC
  Filled 2015-04-25: qty 1

## 2015-04-25 NOTE — ED Provider Notes (Signed)
CSN: [id number]     Arrival date & time 04/25/15  1253 History   First MD Initiated Contact with Patient 04/25/15 1459     Chief Complaint  Patient presents with  . Chest Pain  . Shortness of Breath  . Fatigue     (Consider location/radiation/quality/duration/timing/severity/associated sxs/prior Treatment) Patient is a 60 y.o. male presenting with chest pain. The history is provided by the patient.  Chest Pain Pain location:  L chest Pain quality: dull, radiating and sharp   Pain radiates to:  Upper back, L shoulder and L arm Pain radiates to the back: yes   Pain severity:  Moderate Onset quality:  Gradual Duration:  1 week Timing:  Intermittent Progression:  Waxing and waning Chronicity:  Recurrent Relieved by:  Nothing Worsened by:  Nothing tried Ineffective treatments:  None tried Associated symptoms: dizziness   Associated symptoms: no abdominal pain, no fever, no headache, no palpitations, no shortness of breath and not vomiting   Risk factors: coronary artery disease    60 yo M with a cc of chest pain.  This started about For 5 days ago. Patient states the pain feels like a previous MI that he had. The stent placement 2003. Patient feels it is less pressure than he had at that time however feels similar otherwise. Is nonexertional. Lesser about 20-30 minutes at a time. Nothing seems to make it better or worse. Radiates to his left upper back and down his left arm.  Past Medical History  Diagnosis Date  . HLD (hyperlipidemia)   . Disturbance of skin sensation   . CAD (coronary artery disease)     a.   . Crohn's disease ([place])   . Sleep apnea   . Obesity   . Anal polyp   . Bell's palsy 2009  . Thyroid nodule     left  . TIA (transient ischemic attack) 06/2011  . Acute sinusitis, unspecified   . Gout, unspecified   . Unspecified vitamin D deficiency   . Coronary atherosclerosis of unspecified type of vessel, native or graft   . Regional enteritis of unspecified  site   . Periodic limb movement disorder   . Nontoxic multinodular goiter    Past Surgical History  Procedure Laterality Date  . Appendectomy  1964  . Testicle surgery  1967  . Coronary artery stent placment  03/1998  . Rectal polypectomy  2008, 2009  . Heel spur excision Right 2008   Family History  Problem Relation Age of Onset  . Cancer Mother   . Diabetes Sister   . Cancer Maternal Grandmother   . Cancer Paternal Aunt    Social History  Substance Use Topics  . Smoking status: Former Smoker    Quit date: 03/01/1984  . Smokeless tobacco: None  . Alcohol Use: No    Review of Systems  Constitutional: Negative for fever and chills.  HENT: Negative for congestion and facial swelling.   Eyes: Negative for discharge and visual disturbance.  Respiratory: Negative for shortness of breath.   Cardiovascular: Positive for chest pain. Negative for palpitations.  Gastrointestinal: Negative for vomiting, abdominal pain and diarrhea.  Musculoskeletal: Negative for myalgias and arthralgias.  Skin: Negative for color change and rash.  Neurological: Positive for dizziness. Negative for tremors, syncope and headaches.  Psychiatric/Behavioral: Negative for confusion and dysphoric mood.      Allergies  Lipitor and Simvastatin  Home Medications   Prior to Admission medications   Medication Sig Start Date End Date Taking?  Authorizing Provider  Alpha-D-Galactosidase Satira Mccallum) TABS Take 2 tablets by mouth every morning.   Yes Historical Provider, MD  APRISO 0.375 g 24 hr capsule Take 1,500 mg by mouth daily.  04/13/15  Yes Historical Provider, MD  aspirin EC 325 MG tablet Take 325 mg by mouth 2 (two) times daily.   Yes Historical Provider, MD  Cholecalciferol (VITAMIN D3) 2000 UNITS TABS Take 1 capsule by mouth 2 (two) times daily.    Yes Historical Provider, MD  mercaptopurine (PURINETHOL) 50 MG tablet Take 1.5 tablets by mouth daily.  01/22/13  Yes Historical Provider, MD  metFORMIN  (GLUCOPHAGE) 500 MG tablet Take 500 mg by mouth 2 (two) times daily with a meal.  04/13/15  Yes Historical Provider, MD  metoprolol tartrate (LOPRESSOR) 25 MG tablet Take 25 mg by mouth 2 (two) times daily.  02/12/13  Yes Historical Provider, MD  Omega-3 1000 MG CAPS Take 1 capsule by mouth 2 (two) times daily.    Yes Historical Provider, MD  rosuvastatin (CRESTOR) 20 MG tablet Take 20 mg by mouth daily.   Yes Historical Provider, MD  TRICOR 145 MG tablet Take 1 tablet by mouth daily. 02/12/13  Yes Historical Provider, MD  valsartan-hydrochlorothiazide (DIOVAN-HCT) 160-12.5 MG per tablet Take 1 tablet by mouth daily. 02/13/13  Yes Historical Provider, MD   BP 156/87 mmHg  Pulse 74  Temp(Src) 98.3 F (36.8 C) (Oral)  Resp 18  SpO2 96% Physical Exam  Constitutional: He is oriented to person, place, and time. He appears well-developed and well-nourished.  HENT:  Head: Normocephalic and atraumatic.  Eyes: EOM are normal. Pupils are equal, round, and reactive to light.  Neck: Normal range of motion. Neck supple. No JVD present.  Cardiovascular: Normal rate and regular rhythm.  Exam reveals no gallop and no friction rub.   No murmur heard. Pulmonary/Chest: No respiratory distress. He has no wheezes.  Abdominal: He exhibits no distension. There is no tenderness. There is no rebound and no guarding.  Musculoskeletal: Normal range of motion. He exhibits no tenderness (no tenderness to the trapezius or chest wall).  Neurological: He is alert and oriented to person, place, and time.  Skin: No rash noted. No pallor.  Psychiatric: He has a normal mood and affect. His behavior is normal.  Nursing note and vitals reviewed.   ED Course  Procedures (including critical care time) Labs Review Labs Reviewed  BASIC METABOLIC PANEL - Abnormal; Notable for the following:    Glucose, Bld 291 (*)    BUN 23 (*)    Creatinine, Ser 1.28 (*)    GFR calc non Af Amer 58 (*)    All other components within normal  limits  CBC  I-STAT TROPOININ, ED    Imaging Review Dg Chest 2 View  04/25/2015  CLINICAL DATA:  Left-sided chest pain and shortness of breath for 3 days, initial encounter EXAM: CHEST  2 VIEW COMPARISON:  11/23/2013 FINDINGS: The heart size and mediastinal contours are within normal limits. Both lungs are clear. The visualized skeletal structures are unremarkable. IMPRESSION: No active cardiopulmonary disease. Electronically Signed   By: Inez Catalina M.D.   On: 04/25/2015 13:32   I have personally reviewed and evaluated these images and lab results as part of my medical decision-making.   EKG Interpretation   Date/Time:  Thursday April 25 2015 13:00:50 EDT Ventricular Rate:  82 PR Interval:  179 QRS Duration: 101 QT Interval:  374 QTC Calculation: 437 R Axis:   11 Text Interpretation:  Sinus rhythm No significant change since last  tracing Confirmed by Latham Kinzler MD, DANIEL [phone]) on 04/25/2015 3:00:06 PM      MDM   Final diagnoses:  Chest pain with high risk for cardiac etiology    60 yo M with a chief complaint chest pain. The patient has a history of MI and thinks this feels similar. Initial troponin was negative. EKG unchanged. Taken full aspirin this morning.  Will give nitro. Cardiology consult.    Cards to admit for cath.   The patients results and plan were reviewed and discussed.   Any x-rays performed were independently reviewed by myself.   Differential diagnosis were considered with the presenting HPI.  Medications  nitroGLYCERIN (NITROSTAT) SL tablet 0.4 mg (not administered)    Filed Vitals:   04/25/15 1303 04/25/15 1727 04/25/15 1728  BP: 163/75  156/87  Pulse: 88  74  Temp: 98.3 F (36.8 C)    TempSrc: Oral    Resp: 18 16 18   SpO2: 96%  96%    Final diagnoses:  Chest pain with high risk for cardiac etiology    Admission/ observation were discussed with the admitting physician, patient and/or family and they are comfortable with the plan.     Deno Etienne, DO 04/25/15 1731

## 2015-04-25 NOTE — Consult Note (Signed)
CARDIOLOGY CONSULT NOTE   Patient ID: Jesus Stone MRN: [id number] DOB/AGE: [birth year] 60 y.o.  Admit date: 04/25/2015  Requesting Physician: Dirk Dress ED Primary Physician   Henrine Screws, MD Primary Cardiologist   Dr. Tamala Julian  Reason for Consultation   Chest pain.   HPI: Jesus Stone is a 60 y.o. male with a history of HLD, HTN, mild OSA, DMT2, obesity, Crohns disease, TIA and CAD s/p stenting x2 to ramus in 2000 who presented to [facility] today with chest pain.   Per patient history ( no notes in Epic) he presented in 2000 with chest pressure and left arm pain that woke him from sleep. He had a stress test which was basically normal (mild abnormality) and followed up with Dr. Martinique in the office. He continued to have symptoms and was finally set up for cath which showed two blockages per patient. He said one was unable to be treated but he had PCTA to ramus. He did well for 6 weeks and then started having symptoms again with fatigue and had a relook cath and subsequent stenting to x2 to ramus with a 3.0 x 40mm and a 3.0 x 58mm [employer] stents. Caths were done by Dr. Martinique but he was subsequently followed by Dr. Tamala Julian. He was doing so well that it was felt he could follow PRN.   He was in his usual state of health until this past Monday when he started noting chest squeezing that radiated to his left neck and arm. No diaphoresis or nausea. He has had worsening SOB and fatigue recently over the past several months. This has been constant and he finally called his PCP who advised him to go to the ER. He is feeling fine because he is feeling relaxed but it gets worse with stress.  No LE edema, orthopnea or PND.   Past Medical History  Diagnosis Date  . Headache(784.0)   . Mixed hyperlipidemia   . Nausea alone   . Visual discomfort   . Disturbance of skin sensation   . CAD (coronary artery disease)   . Crohn's disease ([place])   . Sleep apnea   . Obesity   . Anal polyp   . Bell's  palsy 2009  . Thyroid nodule     left  . TIA (transient ischemic attack) 06/2011  . Acute sinusitis, unspecified   . Gout, unspecified   . Unspecified vitamin D deficiency   . Coronary atherosclerosis of unspecified type of vessel, native or graft   . Regional enteritis of unspecified site   . Periodic limb movement disorder   . Nontoxic multinodular goiter      Past Surgical History  Procedure Laterality Date  . Appendectomy  1964  . Testicle surgery  1967  . Coronary artery stent placment  03/1998  . Rectal polypectomy  2008, 2009  . Heel spur excision Right 2008    Allergies  Allergen Reactions  . Lipitor [Atorvastatin]   . Simvastatin     Memory loss    I have reviewed the patient's current medications     nitroGLYCERIN  Prior to Admission medications   Medication Sig Start Date End Date Taking? Authorizing Provider  rosuvastatin (CRESTOR) 20 MG tablet Take 20 mg by mouth daily.   Yes Historical Provider, MD  APRISO 0.375 g 24 hr capsule  04/13/15   Historical Provider, MD  aspirin EC 325 MG tablet Take 325 mg by mouth 2 (two) times daily.  Historical Provider, MD  Cholecalciferol (VITAMIN D3) 2000 UNITS TABS Take 2 capsules by mouth 2 (two) times daily.     Historical Provider, MD  DELZICOL 400 MG CPDR DR capsule Take 1,200 capsules by mouth 2 (two) times daily.  02/12/13   Historical Provider, MD  mercaptopurine (PURINETHOL) 50 MG tablet Take 1 tablet by mouth daily. 01/22/13   Historical Provider, MD  metFORMIN (GLUCOPHAGE) 500 MG tablet  04/13/15   Historical Provider, MD  metoprolol tartrate (LOPRESSOR) 25 MG tablet Take 25 mg by mouth 2 (two) times daily.  02/12/13   Historical Provider, MD  Omega-3 1000 MG CAPS Take 1 capsule by mouth 2 (two) times daily.     Historical Provider, MD  TRICOR 145 MG tablet Take 1 tablet by mouth daily. 02/12/13   Historical Provider, MD  valsartan-hydrochlorothiazide (DIOVAN-HCT) 160-12.5 MG per tablet Take 1 tablet by mouth daily.  02/13/13   Historical Provider, MD     Social History   Social History  . Marital Status: Married    Spouse Name: sharon  . Number of Children: 2  . Years of Education: college   Occupational History  . [employer]    Social History Main Topics  . Smoking status: Former Smoker    Quit date: 03/01/1984  . Smokeless tobacco: Not on file  . Alcohol Use: No  . Drug Use: No  . Sexual Activity: Yes   Other Topics Concern  . Not on file   Social History Narrative   Right handed male    Family Status  Relation Status Death Age  . Mother Alive   . Father Deceased    Family History  Problem Relation Age of Onset  . Cancer Mother   . Diabetes Sister   . Cancer Maternal Grandmother   . Cancer Paternal Aunt      ROS:  Full 14 point review of systems complete and found to be negative unless listed above.  Physical Exam: Blood pressure 163/75, pulse 88, temperature 98.3 F (36.8 C), temperature source Oral, resp. rate 18, SpO2 96 %.  General: Well developed, well nourished, male in no acute distress obese Head: Eyes PERRLA, No xanthomas.   Normocephalic and atraumatic, oropharynx without edema or exudate. :  Lungs: ctab Heart: HRRR S1 S2, no rub/gallop, Heart regular rate and rhythm with S1, S2  murmur. pulses are 2+ extrem.   Neck: No carotid bruits. No lymphadenopathy.  No JVD. Abdomen: Bowel sounds present, abdomen soft and non-tender without masses or hernias noted. Msk:  No spine or cva tenderness. No weakness, no joint deformities or effusions. Extremities: No clubbing or cyanosis.  No edema.  Neuro: Alert and oriented X 3. No focal deficits noted. Psych:  Good affect, responds appropriately Skin: No rashes or lesions noted.  Labs:   Lab Results  Component Value Date   WBC 6.1 04/25/2015   HGB 13.7 04/25/2015   HCT 39.8 04/25/2015   MCV 84.7 04/25/2015   PLT 243 04/25/2015   No results for input(s): INR in the last 72 hours.   Recent Labs Lab  04/25/15 1324  NA 140  K 4.0  CL 102  CO2 26  BUN 23*  CREATININE 1.28*  CALCIUM 9.4  GLUCOSE 291*   No results found for: MG No results for input(s): CKTOTAL, CKMB, TROPONINI in the last 72 hours.  Recent Labs  04/25/15 1332  TROPIPOC 0.01   No results found for: PROBNP Lab Results  Component Value Date  CHOL 147 03/01/2013   HDL 40 03/01/2013   LDLCALC 59 03/01/2013   TRIG 238* 03/01/2013   No results found for: DDIMER No results found for: LIPASE, AMYLASE TSH  Date/Time Value Ref Range Status  03/01/2013 02:49 PM 0.802 0.450 - 4.500 uIU/mL Final   VITAMIN B-12  Date/Time Value Ref Range Status  03/01/2013 02:49 PM 376 211 - 946 pg/mL Final     ECG:  NSR   Radiology:  Dg Chest 2 View  04/25/2015  CLINICAL DATA:  Left-sided chest pain and shortness of breath for 3 days, initial encounter EXAM: CHEST  2 VIEW COMPARISON:  11/23/2013 FINDINGS: The heart size and mediastinal contours are within normal limits. Both lungs are clear. The visualized skeletal structures are unremarkable. IMPRESSION: No active cardiopulmonary disease. Electronically Signed   By: Inez Catalina M.D.   On: 04/25/2015 13:32    ASSESSMENT AND PLAN:    Active Problems:   Chest pain   CAD (coronary artery disease)  KENDRICH CAMPER is a 60 y.o. male with a history of HLD, HTN, mild OSA, DMT2, Crohns disease, TIA and CAD s/p stenting x2 to ramus in 2000 who presented to [facility] today with chest pain.   CAD/Chest pain: chest pain similar to previous anginal pain requiring stenting in the setting of previous normal stress test.  Troponin neg x1 and ECG with no acute ST or TW changes. He will likely need a heart cath. Continue ASA, statin and BB.  HTN: continue home regimen  DMT2: hold metformin   HLD: continue statin    Signed: Eileen Stanford, PA-C 04/25/2015 4:08 PM  Pager [phone]  Co-Sign MD  I have examined the patient and reviewed assessment and plan and discussed with patient.   Agree with above as stated.  Unstable angina.  Given that it has been 17 years since prior cath, would plan on cath to evaluate.  Stress test unlikely to be helpful in this setting. Continue to manage DM and HTN and hyperlipidemia as noted above.  Adley Mazurowski S.

## 2015-04-25 NOTE — Progress Notes (Signed)
Called by ER at [facility] regarding Mr. Jesus Stone. He was apparently seen today and cardiac cath was recommended. It appears to be scheduled tomorrow with Dr. Martinique at 10:30 am. I was asked if the patient should be transferred to [facility]. I agree that he should be transferred, pending bed availability. Can be on my service, telemetry bed. It appears he has cath orders placed.  Pixie Casino, MD, [facility] Attending Cardiologist [place]

## 2015-04-25 NOTE — ED Notes (Signed)
Pt c/o chest discomfort. Pain score 2/10.  Sts symptoms began x 3 days ago and get worse w/ stress/anxiety.  Sts SOB x "a while," but sts "I thought it had to do w/ being out of shape."

## 2015-04-25 NOTE — ED Notes (Signed)
Monday began having left shoulder/arm/neck pain. Over the week the pain has gone into his left chest and back. Has also been having heart palpitations and having dizziness while sitting. This morning he began having numbness and increased pain in his arm. Also c/o occasionally SOB and fatigue. Denies N/V/D

## 2015-04-25 NOTE — ED Notes (Signed)
MD at bedside. 

## 2015-04-26 ENCOUNTER — Encounter (HOSPITAL_COMMUNITY)
Admission: EM | Disposition: A | Discharge status: Home / Self Care | Payer: Self-pay | Source: Home / Self Care | Attending: Internal Medicine

## 2015-04-26 HISTORY — PX: CARDIAC CATHETERIZATION: SHX172

## 2015-04-26 LAB — COMPREHENSIVE METABOLIC PANEL
ALT: 39 U/L (ref 17–63)
AST: 50 U/L — ABNORMAL HIGH (ref 15–41)
Albumin: 3.6 g/dL (ref 3.5–5.0)
Alkaline Phosphatase: 44 U/L (ref 38–126)
Anion gap: 10 (ref 5–15)
BUN: 19 mg/dL (ref 6–20)
CO2: 25 mmol/L (ref 22–32)
Calcium: 9.2 mg/dL (ref 8.9–10.3)
Chloride: 104 mmol/L (ref 101–111)
Creatinine, Ser: 1.17 mg/dL (ref 0.61–1.24)
GFR calc Af Amer: >60 mL/min (ref >60)
GFR calc non Af Amer: >60 mL/min (ref >60)
Glucose, Bld: 181 mg/dL — ABNORMAL HIGH (ref 65–99)
Potassium: 3.7 mmol/L (ref 3.5–5.1)
Sodium: 139 mmol/L (ref 135–145)
Total Bilirubin: 0.6 mg/dL (ref 0.3–1.2)
Total Protein: 6.8 g/dL (ref 6.5–8.1)

## 2015-04-26 LAB — CBC
HCT: 37.1 % — ABNORMAL LOW (ref 39.0–52.0)
Hemoglobin: 12.5 g/dL — ABNORMAL LOW (ref 13.0–17.0)
MCH: 29 pg (ref 26.0–34.0)
MCHC: 33.7 g/dL (ref 30.0–36.0)
MCV: 86.1 fL (ref 78.0–100.0)
Platelets: 189 10*3/uL (ref 150–400)
RBC: 4.31 MIL/uL (ref 4.22–5.81)
RDW: 13.6 % (ref 11.5–15.5)
WBC: 6.6 10*3/uL (ref 4.0–10.5)

## 2015-04-26 LAB — POCT ACTIVATED CLOTTING TIME
Activated Clotting Time: 245 seconds
Activated Clotting Time: 337 seconds

## 2015-04-26 LAB — PROTIME-INR
INR: 1.15 (ref 0.00–1.49)
Prothrombin Time: 14.9 seconds (ref 11.6–15.2)

## 2015-04-26 LAB — GLUCOSE, CAPILLARY
Glucose-Capillary: 161 mg/dL — ABNORMAL HIGH (ref 65–99)
Glucose-Capillary: 156 mg/dL — ABNORMAL HIGH (ref 65–99)
Glucose-Capillary: 160 mg/dL — ABNORMAL HIGH (ref 65–99)
Glucose-Capillary: 237 mg/dL — ABNORMAL HIGH (ref 65–99)
Glucose-Capillary: 147 mg/dL — ABNORMAL HIGH (ref 65–99)

## 2015-04-26 LAB — MRSA PCR SCREENING: MRSA by PCR: NEGATIVE

## 2015-04-26 LAB — LIPID PANEL
Cholesterol: 98 mg/dL (ref 0–200)
HDL: 30 mg/dL — ABNORMAL LOW (ref >40)
LDL Cholesterol: 24 mg/dL (ref 0–99)
Total CHOL/HDL Ratio: 3.3 RATIO
Triglycerides: 222 mg/dL — ABNORMAL HIGH (ref <150)
VLDL: 44 mg/dL — ABNORMAL HIGH (ref 0–40)

## 2015-04-26 LAB — TROPONIN I
Troponin I: <0.03 ng/mL (ref <0.031)
Troponin I: <0.03 ng/mL (ref <0.031)
Troponin I: <0.03 ng/mL (ref <0.031)

## 2015-04-26 SURGERY — LEFT HEART CATH AND CORONARY ANGIOGRAPHY
Anesthesia: LOCAL

## 2015-04-26 MED ORDER — ASPIRIN 81 MG PO CHEW
81.0000 mg | CHEWABLE_TABLET | ORAL | Status: AC
  Administered 2015-04-26: 81 mg via ORAL
  Filled 2015-04-26: qty 1

## 2015-04-26 MED ORDER — IOPAMIDOL (ISOVUE-370) INJECTION 76%
INTRAVENOUS | Status: AC
  Filled 2015-04-26: qty 50

## 2015-04-26 MED ORDER — ANGIOPLASTY BOOK
Freq: Once | Status: AC
  Administered 2015-04-26: 20:00:00
  Filled 2015-04-26: qty 1

## 2015-04-26 MED ORDER — TICAGRELOR 90 MG PO TABS
ORAL_TABLET | ORAL | Status: DC | PRN
  Administered 2015-04-26: 180 mg via ORAL

## 2015-04-26 MED ORDER — FENTANYL CITRATE (PF) 100 MCG/2ML IJ SOLN
INTRAMUSCULAR | Status: DC | PRN
  Administered 2015-04-26: 25 ug via INTRAVENOUS

## 2015-04-26 MED ORDER — LIDOCAINE HCL (PF) 1 % IJ SOLN
INTRAMUSCULAR | Status: AC
  Filled 2015-04-26: qty 30

## 2015-04-26 MED ORDER — ADENOSINE 12 MG/4ML IV SOLN
16.0000 mL | Freq: Once | INTRAVENOUS | Status: AC
  Administered 2015-04-26: 48 mg via INTRAVENOUS
  Filled 2015-04-26: qty 16

## 2015-04-26 MED ORDER — SODIUM CHLORIDE 0.9% FLUSH
3.0000 mL | Freq: Two times a day (BID) | INTRAVENOUS | Status: DC
  Administered 2015-04-26 (×2): 3 mL via INTRAVENOUS

## 2015-04-26 MED ORDER — SODIUM CHLORIDE 0.9 % WEIGHT BASED INFUSION: 1.0000 mL/kg/h | INTRAVENOUS | Status: DC

## 2015-04-26 MED ORDER — HEPARIN SODIUM (PORCINE) 1000 UNIT/ML IJ SOLN
INTRAMUSCULAR | Status: DC | PRN
  Administered 2015-04-26: 4000 [IU] via INTRAVENOUS
  Administered 2015-04-26: 3000 [IU] via INTRAVENOUS
  Administered 2015-04-26: 5000 [IU] via INTRAVENOUS

## 2015-04-26 MED ORDER — SODIUM CHLORIDE 0.9% FLUSH: 3.0000 mL | INTRAVENOUS | Status: DC | PRN

## 2015-04-26 MED ORDER — SODIUM CHLORIDE 0.9 % WEIGHT BASED INFUSION: 3.0000 mL/kg/h | INTRAVENOUS | Status: DC

## 2015-04-26 MED ORDER — MIDAZOLAM HCL 2 MG/2ML IJ SOLN
INTRAMUSCULAR | Status: AC
  Filled 2015-04-26: qty 2

## 2015-04-26 MED ORDER — ASPIRIN EC 81 MG PO TBEC
81.0000 mg | DELAYED_RELEASE_TABLET | Freq: Every day | ORAL | Status: DC
  Administered 2015-04-27: 81 mg via ORAL
  Filled 2015-04-26: qty 1

## 2015-04-26 MED ORDER — IOPAMIDOL (ISOVUE-370) INJECTION 76%
INTRAVENOUS | Status: DC | PRN
  Administered 2015-04-26: 130 mL

## 2015-04-26 MED ORDER — MIDAZOLAM HCL 2 MG/2ML IJ SOLN
INTRAMUSCULAR | Status: DC | PRN
  Administered 2015-04-26: 1 mg via INTRAVENOUS

## 2015-04-26 MED ORDER — IOPAMIDOL (ISOVUE-370) INJECTION 76%
INTRAVENOUS | Status: AC
  Filled 2015-04-26: qty 100

## 2015-04-26 MED ORDER — SODIUM CHLORIDE 0.9 % WEIGHT BASED INFUSION
3.0000 mL/kg/h | INTRAVENOUS | Status: AC
  Administered 2015-04-26: 3 mL/kg/h via INTRAVENOUS

## 2015-04-26 MED ORDER — HEPARIN (PORCINE) IN NACL 2-0.9 UNIT/ML-% IJ SOLN
INTRAMUSCULAR | Status: DC | PRN
  Administered 2015-04-26: 15:00:00

## 2015-04-26 MED ORDER — VERAPAMIL HCL 2.5 MG/ML IV SOLN
INTRAVENOUS | Status: AC
  Filled 2015-04-26: qty 2

## 2015-04-26 MED ORDER — HEPARIN SODIUM (PORCINE) 1000 UNIT/ML IJ SOLN
INTRAMUSCULAR | Status: AC
  Filled 2015-04-26: qty 1

## 2015-04-26 MED ORDER — SODIUM CHLORIDE 0.9 % IV SOLN: 250.0000 mL | INTRAVENOUS | Status: DC | PRN

## 2015-04-26 MED ORDER — HEPARIN (PORCINE) IN NACL 2-0.9 UNIT/ML-% IJ SOLN
INTRAMUSCULAR | Status: AC
  Filled 2015-04-26: qty 1000

## 2015-04-26 MED ORDER — LIDOCAINE HCL (PF) 1 % IJ SOLN
INTRAMUSCULAR | Status: DC | PRN
  Administered 2015-04-26: 3 mL

## 2015-04-26 MED ORDER — HEPARIN (PORCINE) IN NACL 2-0.9 UNIT/ML-% IJ SOLN
INTRAMUSCULAR | Status: DC | PRN
  Administered 2015-04-26: 10 mL via INTRA_ARTERIAL

## 2015-04-26 MED ORDER — SODIUM CHLORIDE 0.9% FLUSH
3.0000 mL | Freq: Two times a day (BID) | INTRAVENOUS | Status: DC
Start: 2015-04-26 — End: 2015-04-27

## 2015-04-26 MED ORDER — FENTANYL CITRATE (PF) 100 MCG/2ML IJ SOLN
INTRAMUSCULAR | Status: AC
  Filled 2015-04-26: qty 2

## 2015-04-26 MED ORDER — SODIUM CHLORIDE 0.9 % IV SOLN
250.0000 mL | INTRAVENOUS | Status: DC | PRN
Start: 2015-04-26 — End: 2015-04-26

## 2015-04-26 MED ORDER — ASPIRIN 81 MG PO CHEW: 81.0000 mg | CHEWABLE_TABLET | ORAL | Status: DC

## 2015-04-26 MED ORDER — TICAGRELOR 90 MG PO TABS
90.0000 mg | ORAL_TABLET | Freq: Two times a day (BID) | ORAL | Status: DC
Start: 2015-04-26 — End: 2015-04-27
  Administered 2015-04-27 (×2): 90 mg via ORAL
  Filled 2015-04-26 (×2): qty 1

## 2015-04-26 MED ORDER — FENOFIBRATE 160 MG PO TABS
160.0000 mg | ORAL_TABLET | Freq: Every day | ORAL | Status: DC
  Administered 2015-04-26 – 2015-04-27 (×2): 160 mg via ORAL
  Filled 2015-04-26 (×2): qty 1

## 2015-04-26 MED ORDER — NITROGLYCERIN 2 % TD OINT
0.5000 [in_us] | TOPICAL_OINTMENT | Freq: Four times a day (QID) | TRANSDERMAL | Status: DC
  Administered 2015-04-26: 0.5 [in_us] via TOPICAL
  Filled 2015-04-26: qty 30

## 2015-04-26 MED ORDER — TICAGRELOR 90 MG PO TABS
ORAL_TABLET | ORAL | Status: AC
  Filled 2015-04-26: qty 1

## 2015-04-26 MED ORDER — LIVING WELL WITH DIABETES BOOK
Freq: Once | Status: AC
  Administered 2015-04-26: 20:00:00
  Filled 2015-04-26: qty 1

## 2015-04-26 SURGICAL SUPPLY — 19 items
BALLN EUPHORA RX 2.5X15 (BALLOONS) ×2
BALLN ~~LOC~~ EMERGE MR 3.25X30 (BALLOONS) ×2
BALLOON EUPHORA RX 2.5X15 (BALLOONS) ×1 IMPLANT
BALLOON ~~LOC~~ EMERGE MR 3.25X30 (BALLOONS) ×1 IMPLANT
CATH INFINITI 5 FR JL3.5 (CATHETERS) ×2 IMPLANT
CATH INFINITI 5FR ANG PIGTAIL (CATHETERS) ×2 IMPLANT
CATH INFINITI JR4 5F (CATHETERS) ×2 IMPLANT
CATH VISTA GUIDE 6FR XBLAD3.5 (CATHETERS) ×2 IMPLANT
DEVICE RAD COMP TR BAND LRG (VASCULAR PRODUCTS) ×2 IMPLANT
GLIDESHEATH SLEND SS 6F .021 (SHEATH) ×2 IMPLANT
GUIDEWIRE PRESSURE COMET II (WIRE) ×2 IMPLANT
KIT ENCORE 26 ADVANTAGE (KITS) ×2 IMPLANT
KIT HEART LEFT (KITS) ×2 IMPLANT
PACK CARDIAC CATHETERIZATION (CUSTOM PROCEDURE TRAY) ×2 IMPLANT
STENT PROMUS PREM MR 3.0X38 (Permanent Stent) ×2 IMPLANT
SYR MEDRAD MARK V 150ML (SYRINGE) ×2 IMPLANT
TRANSDUCER W/STOPCOCK (MISCELLANEOUS) ×2 IMPLANT
TUBING CIL FLEX 10 FLL-RA (TUBING) ×2 IMPLANT
WIRE SAFE-T 1.5MM-J .035X260CM (WIRE) ×2 IMPLANT

## 2015-04-26 NOTE — Interval H&P Note (Signed)
History and Physical Interval Note:  04/26/2015 1:57 PM  Jesus Stone  has presented today for surgery, with the diagnosis of cp  The various methods of treatment have been discussed with the patient and family. After consideration of risks, benefits and other options for treatment, the patient has consented to  Procedure(s): Left Heart Cath and Coronary Angiography (N/A) as a surgical intervention .  The patient's history has been reviewed, patient examined, no change in status, stable for surgery.  I have reviewed the patient's chart and labs.  Questions were answered to the patient's satisfaction.   Cath Lab Visit (complete for each Cath Lab visit)  Clinical Evaluation Leading to the Procedure:   ACS: Yes.    Non-ACS:    Anginal Classification: CCS IV  Anti-ischemic medical therapy: Minimal Therapy (1 class of medications)  Non-Invasive Test Results: No non-invasive testing performed  Prior CABG: No previous CABG        [name] [facility] 04/26/2015 1:57 PM

## 2015-04-26 NOTE — H&P (View-Only) (Signed)
SUBJECTIVE:  No chest pain last night  OBJECTIVE:   Vitals:   Filed Vitals:   04/26/15 0800 04/26/15 0900 04/26/15 1000 04/26/15 1100  BP: 126/68 131/71 148/84 133/79  Pulse: 58 55 56 58  Temp:      TempSrc:      Resp: 15 17 17 13   Height:      Weight:      SpO2: 95% 96% 98% 94%   I&O's:   Intake/Output Summary (Last 24 hours) at 04/26/15 1142 Last data filed at 04/26/15 0015  Gross per 24 hour  Intake      3 ml  Output      0 ml  Net      3 ml   TELEMETRY: Reviewed telemetry pt in NSR:     PHYSICAL EXAM General: Well developed, well nourished, in no acute distress Head:   Normal cephalic and atramatic  Lungs:   Clear bilaterally to auscultation. Heart:   HRRR S1 S2  No JVD.   Abdomen: abdomen soft and non-tender Msk:  Back normal,  Normal strength and tone for age. Extremities:   No edema.   Neuro: Alert and oriented. Psych:  Normal affect, responds appropriately Skin: No rash   LABS: Basic Metabolic Panel:  Recent Labs  04/25/15 1324 04/25/15 1921 04/26/15 0222  NA 140  --  139  K 4.0  --  3.7  CL 102  --  104  CO2 26  --  25  GLUCOSE 291*  --  181*  BUN 23*  --  19  CREATININE 1.28* 1.16 1.17  CALCIUM 9.4  --  9.2   Liver Function Tests:  Recent Labs  04/26/15 0222  AST 50*  ALT 39  ALKPHOS 44  BILITOT 0.6  PROT 6.8  ALBUMIN 3.6   No results for input(s): LIPASE, AMYLASE in the last 72 hours. CBC:  Recent Labs  04/25/15 1921 04/26/15 0222  WBC 5.9 6.6  HGB 12.5* 12.5*  HCT 37.1* 37.1*  MCV 86.5 86.1  PLT 215 189   Cardiac Enzymes:  Recent Labs  04/25/15 1921 04/26/15 0222 04/26/15 0748  TROPONINI <0.03 <0.03 <0.03   BNP: Invalid input(s): POCBNP D-Dimer: No results for input(s): DDIMER in the last 72 hours. Hemoglobin A1C: No results for input(s): HGBA1C in the last 72 hours. Fasting Lipid Panel:  Recent Labs  04/26/15 0222  CHOL 98  HDL 30*  LDLCALC 24  TRIG 222*  CHOLHDL 3.3   Thyroid Function  Tests: No results for input(s): TSH, T4TOTAL, T3FREE, THYROIDAB in the last 72 hours.  Invalid input(s): FREET3 Anemia Panel: No results for input(s): VITAMINB12, FOLATE, FERRITIN, TIBC, IRON, RETICCTPCT in the last 72 hours. Coag Panel:   Lab Results  Component Value Date   INR 1.15 04/26/2015    RADIOLOGY: Dg Chest 2 View  04/25/2015  CLINICAL DATA:  Left-sided chest pain and shortness of breath for 3 days, initial encounter EXAM: CHEST  2 VIEW COMPARISON:  11/23/2013 FINDINGS: The heart size and mediastinal contours are within normal limits. Both lungs are clear. The visualized skeletal structures are unremarkable. IMPRESSION: No active cardiopulmonary disease. Electronically Signed   By: Inez Catalina M.D.   On: 04/25/2015 13:32      ASSESSMENT: Kathyrn Lass:    Awaiting cath.  Was on the cath table but case was postponed for an emergency.  Will need aggressive secondary prevention.  Further plans based in cath result.   Taking fenofibrate and Crestor for hyperlipidemia.  Jettie Booze, MD  04/26/2015  11:42 AM

## 2015-04-26 NOTE — Progress Notes (Addendum)
SUBJECTIVE:  No chest pain last night  OBJECTIVE:   Vitals:   Filed Vitals:   04/26/15 0800 04/26/15 0900 04/26/15 1000 04/26/15 1100  BP: 126/68 131/71 148/84 133/79  Pulse: 58 55 56 58  Temp:      TempSrc:      Resp: 15 17 17 13   Height:      Weight:      SpO2: 95% 96% 98% 94%   I&O's:   Intake/Output Summary (Last 24 hours) at 04/26/15 1142 Last data filed at 04/26/15 0015  Gross per 24 hour  Intake      3 ml  Output      0 ml  Net      3 ml   TELEMETRY: Reviewed telemetry pt in NSR:     PHYSICAL EXAM General: Well developed, well nourished, in no acute distress Head:   Normal cephalic and atramatic  Lungs:   Clear bilaterally to auscultation. Heart:   HRRR S1 S2  No JVD.   Abdomen: abdomen soft and non-tender Msk:  Back normal,  Normal strength and tone for age. Extremities:   No edema.   Neuro: Alert and oriented. Psych:  Normal affect, responds appropriately Skin: No rash   LABS: Basic Metabolic Panel:  Recent Labs  04/25/15 1324 04/25/15 1921 04/26/15 0222  NA 140  --  139  K 4.0  --  3.7  CL 102  --  104  CO2 26  --  25  GLUCOSE 291*  --  181*  BUN 23*  --  19  CREATININE 1.28* 1.16 1.17  CALCIUM 9.4  --  9.2   Liver Function Tests:  Recent Labs  04/26/15 0222  AST 50*  ALT 39  ALKPHOS 44  BILITOT 0.6  PROT 6.8  ALBUMIN 3.6   No results for input(s): LIPASE, AMYLASE in the last 72 hours. CBC:  Recent Labs  04/25/15 1921 04/26/15 0222  WBC 5.9 6.6  HGB 12.5* 12.5*  HCT 37.1* 37.1*  MCV 86.5 86.1  PLT 215 189   Cardiac Enzymes:  Recent Labs  04/25/15 1921 04/26/15 0222 04/26/15 0748  TROPONINI <0.03 <0.03 <0.03   BNP: Invalid input(s): POCBNP D-Dimer: No results for input(s): DDIMER in the last 72 hours. Hemoglobin A1C: No results for input(s): HGBA1C in the last 72 hours. Fasting Lipid Panel:  Recent Labs  04/26/15 0222  CHOL 98  HDL 30*  LDLCALC 24  TRIG 222*  CHOLHDL 3.3   Thyroid Function  Tests: No results for input(s): TSH, T4TOTAL, T3FREE, THYROIDAB in the last 72 hours.  Invalid input(s): FREET3 Anemia Panel: No results for input(s): VITAMINB12, FOLATE, FERRITIN, TIBC, IRON, RETICCTPCT in the last 72 hours. Coag Panel:   Lab Results  Component Value Date   INR 1.15 04/26/2015    RADIOLOGY: Dg Chest 2 View  04/25/2015  CLINICAL DATA:  Left-sided chest pain and shortness of breath for 3 days, initial encounter EXAM: CHEST  2 VIEW COMPARISON:  11/23/2013 FINDINGS: The heart size and mediastinal contours are within normal limits. Both lungs are clear. The visualized skeletal structures are unremarkable. IMPRESSION: No active cardiopulmonary disease. Electronically Signed   By: Inez Catalina M.D.   On: 04/25/2015 13:32      ASSESSMENT: Kathyrn Lass:    Awaiting cath.  Was on the cath table but case was postponed for an emergency.  Will need aggressive secondary prevention.  Further plans based in cath result.   Taking fenofibrate and Crestor for hyperlipidemia.  Jettie Booze, MD  04/26/2015  11:42 AM

## 2015-04-26 NOTE — H&P (Addendum)
Admission H&P  Patient ID: TREVAUGHN MULLANE MRN: [id number] DOB/AGE: [birth year] 62 y.o.  Admit date: 04/25/2015  Requesting Physician: Dirk Dress ED Primary Physician   Henrine Screws, MD Primary Cardiologist   Dr. Tamala Julian  Reason for Consultation   Chest pain.   HPI: MALIKAH PILE is a 60 y.o. male with a history of HLD, HTN, mild OSA, DMT2, obesity, Crohns disease, TIA and CAD s/p stenting x2 to ramus in 2000 who presented to [facility] today with chest pain.   Per patient history ( no notes in Epic) he presented in 2000 with chest pressure and left arm pain that woke him from sleep. He had a stress test which was basically normal (mild abnormality) and followed up with Dr. Martinique in the office. He continued to have symptoms and was finally set up for cath which showed two blockages per patient. He said one was unable to be treated but he had PCTA to ramus. He did well for 6 weeks and then started having symptoms again with fatigue and had a relook cath and subsequent stenting to x2 to ramus with a 3.0 x 62mm and a 3.0 x 5mm Tristar stents. Caths were done by Dr. Martinique but he was subsequently followed by Dr. Tamala Julian. He was doing so well that it was felt he could follow PRN.   He was in his usual state of health until this past Monday when he started noting chest squeezing that radiated to his left neck and arm. No diaphoresis or nausea. He has had worsening SOB and fatigue recently over the past several months. This has been constant and he finally called his PCP who advised him to go to the ER. He is feeling fine because he is feeling relaxed but it gets worse with stress.  No LE edema, orthopnea or PND.   Past Medical History  Diagnosis Date  . HLD (hyperlipidemia)   . Disturbance of skin sensation   . CAD (coronary artery disease)     a.   . Crohn's disease ([place])   . Sleep apnea   . Obesity   . Anal polyp   . Bell's palsy 2009  . Thyroid nodule     left  . TIA (transient ischemic  attack) 06/2011  . Acute sinusitis, unspecified   . Gout, unspecified   . Unspecified vitamin D deficiency   . Coronary atherosclerosis of unspecified type of vessel, native or graft   . Regional enteritis of unspecified site   . Periodic limb movement disorder   . Nontoxic multinodular goiter      Past Surgical History  Procedure Laterality Date  . Appendectomy  1964  . Testicle surgery  1967  . Coronary artery stent placment  03/1998  . Rectal polypectomy  2008, 2009  . Heel spur excision Right 2008    Allergies  Allergen Reactions  . Lipitor [Atorvastatin]   . Simvastatin     Memory loss    I have reviewed the patient's current medications . aspirin  81 mg Oral Pre-Cath  . [START ON 04/27/2015] aspirin EC  81 mg Oral Daily  . fenofibrate  160 mg Oral Daily  . heparin  5,000 Units Subcutaneous 3 times per day  . irbesartan  150 mg Oral Daily   And  . hydrochlorothiazide  12.5 mg Oral Daily  . insulin aspart  0-15 Units Subcutaneous TID WC  . insulin aspart  0-5 Units Subcutaneous QHS  . mesalamine  1.2 g Oral Daily  .  metoprolol tartrate  25 mg Oral BID  . nitroGLYCERIN  0.5 inch Topical 4 times per day  . rosuvastatin  20 mg Oral Daily  . sodium chloride flush  3 mL Intravenous Q12H   . [START ON 04/27/2015] sodium chloride     Followed by  . [START ON 04/27/2015] sodium chloride     sodium chloride, acetaminophen, ALPRAZolam, nitroGLYCERIN, ondansetron (ZOFRAN) IV, sodium chloride flush, zolpidem  Prior to Admission medications   Medication Sig Start Date End Date Taking? Authorizing Provider  rosuvastatin (CRESTOR) 20 MG tablet Take 20 mg by mouth daily.   Yes Historical Provider, MD  APRISO 0.375 g 24 hr capsule  04/13/15   Historical Provider, MD  aspirin EC 325 MG tablet Take 325 mg by mouth 2 (two) times daily.    Historical Provider, MD  Cholecalciferol (VITAMIN D3) 2000 UNITS TABS Take 2 capsules by mouth 2 (two) times daily.     Historical Provider, MD    DELZICOL 400 MG CPDR DR capsule Take 1,200 capsules by mouth 2 (two) times daily.  02/12/13   Historical Provider, MD  mercaptopurine (PURINETHOL) 50 MG tablet Take 1 tablet by mouth daily. 01/22/13   Historical Provider, MD  metFORMIN (GLUCOPHAGE) 500 MG tablet  04/13/15   Historical Provider, MD  metoprolol tartrate (LOPRESSOR) 25 MG tablet Take 25 mg by mouth 2 (two) times daily.  02/12/13   Historical Provider, MD  Omega-3 1000 MG CAPS Take 1 capsule by mouth 2 (two) times daily.     Historical Provider, MD  TRICOR 145 MG tablet Take 1 tablet by mouth daily. 02/12/13   Historical Provider, MD  valsartan-hydrochlorothiazide (DIOVAN-HCT) 160-12.5 MG per tablet Take 1 tablet by mouth daily. 02/13/13   Historical Provider, MD     Social History   Social History  . Marital Status: Married    Spouse Name: sharon  . Number of Children: 2  . Years of Education: college   Occupational History  . [employer]    Social History Main Topics  . Smoking status: Former Smoker    Quit date: 03/01/1984  . Smokeless tobacco: Not on file  . Alcohol Use: No  . Drug Use: No  . Sexual Activity: Yes   Other Topics Concern  . Not on file   Social History Narrative   Right handed male    Family Status  Relation Status Death Age  . Mother Alive   . Father Deceased    Family History  Problem Relation Age of Onset  . Cancer Mother   . Diabetes Sister   . Cancer Maternal Grandmother   . Cancer Paternal Aunt      ROS:  Full 14 point review of systems complete and found to be negative unless listed above.  Physical Exam: Blood pressure 132/72, pulse 54, temperature 97.6 F (36.4 C), temperature source Oral, resp. rate 9, height 5\' 11"  (1.803 m), weight 240 lb 15.4 oz (109.3 kg), SpO2 97 %.  General: Well developed, well nourished, male in no acute distress obese Head: Eyes PERRLA, No xanthomas.   Normocephalic and atraumatic, oropharynx without edema or exudate. :  Lungs: ctab Heart: HRRR  S1 S2, no rub/gallop, Heart regular rate and rhythm with S1, S2  murmur. pulses are 2+ extrem.   Neck: No carotid bruits. No lymphadenopathy.  No JVD. Abdomen: Bowel sounds present, abdomen soft and non-tender without masses or hernias noted. Msk:  No spine or cva tenderness. No weakness, no joint deformities or effusions.  Extremities: No clubbing or cyanosis.  No edema.  Neuro: Alert and oriented X 3. No focal deficits noted. Psych:  Good affect, responds appropriately Skin: No rashes or lesions noted.  Labs:   Lab Results  Component Value Date   WBC 6.6 04/26/2015   HGB 12.5* 04/26/2015   HCT 37.1* 04/26/2015   MCV 86.1 04/26/2015   PLT 189 04/26/2015    Recent Labs  04/26/15 0222  INR 1.15     Recent Labs Lab 04/26/15 0222  NA 139  K 3.7  CL 104  CO2 25  BUN 19  CREATININE 1.17  CALCIUM 9.2  PROT 6.8  BILITOT 0.6  ALKPHOS 44  ALT 39  AST 50*  GLUCOSE 181*  ALBUMIN 3.6   No results found for: MG  Recent Labs  04/25/15 1921 04/26/15 0222  TROPONINI <0.03 <0.03    Recent Labs  04/25/15 1332  TROPIPOC 0.01   No results found for: PROBNP Lab Results  Component Value Date   CHOL 98 04/26/2015   HDL 30* 04/26/2015   LDLCALC 24 04/26/2015   TRIG 222* 04/26/2015   No results found for: DDIMER No results found for: LIPASE, AMYLASE TSH  Date/Time Value Ref Range Status  03/01/2013 02:49 PM 0.802 0.450 - 4.500 uIU/mL Final   VITAMIN B-12  Date/Time Value Ref Range Status  03/01/2013 02:49 PM 376 211 - 946 pg/mL Final     ECG:  NSR   Radiology:  Dg Chest 2 View  04/25/2015  CLINICAL DATA:  Left-sided chest pain and shortness of breath for 3 days, initial encounter EXAM: CHEST  2 VIEW COMPARISON:  11/23/2013 FINDINGS: The heart size and mediastinal contours are within normal limits. Both lungs are clear. The visualized skeletal structures are unremarkable. IMPRESSION: No active cardiopulmonary disease. Electronically Signed   By: Inez Catalina M.D.    On: 04/25/2015 13:32    ASSESSMENT AND PLAN:    Active Problems:   Chest pain   CAD (coronary artery disease)   HLD (hyperlipidemia)   Chest pain with moderate risk of acute coronary syndrome  KAIQUE CARTWRIGHT is a 60 y.o. male with a history of HLD, HTN, mild OSA, DMT2, Crohns disease, TIA and CAD s/p stenting x2 to ramus in 2000 who presented to [facility] today with chest pain.   CAD/Chest pain: chest pain similar to previous anginal pain requiring stenting in the setting of previous normal stress test.  Troponin neg x1 and ECG with no acute ST or TW changes. He will likely need a heart cath. Continue ASA, statin and BB.  HTN: continue home regimen  DMT2: hold metformin   HLD: continue statin   The patient will be transferred to [facility] for cardiac catheterization tomorrow.  Pixie Casino, MD, [facility] Attending Cardiologist [place]

## 2015-04-26 NOTE — Care Management Note (Signed)
Case Management Note  Patient Details  Name: Jesus Stone MRN: [id number] Date of Birth: 1956-01-06  Subjective/Objective:        Chest pain            Action/Plan: Discharge Planning:  NCM spoke to pt and wife, Ivin Booty at bedside. Provided pt with Brilinta 30 day free trial card and copay card. Contacted [facility] in [place] and they do have medication in stock.   PCP- Josetta Huddle MD  Expected Discharge Date:04/27/2015               Expected Discharge Plan:  Home/Self Care  In-House Referral:  NA  Discharge planning Services  CM Consult  Post Acute Care Choice:  NA Choice offered to:  NA  DME Arranged:  N/A DME Agency:  NA  HH Arranged:  NA HH Agency:  NA  Status of Service:  Completed, signed off  Medicare Important Message Given:    Date Medicare IM Given:    Medicare IM give by:    Date Additional Medicare IM Given:    Additional Medicare Important Message give by:     If discussed at [place] of Stay Meetings, dates discussed:    Additional Comments:  Erenest Rasher, RN 04/26/2015, 5:28 PM

## 2015-04-27 ENCOUNTER — Encounter (HOSPITAL_COMMUNITY): Payer: Self-pay | Admitting: Physician Assistant

## 2015-04-27 DIAGNOSIS — K5090 Crohn's disease, unspecified, without complications: Secondary | ICD-10-CM | POA: Diagnosis present

## 2015-04-27 DIAGNOSIS — G4730 Sleep apnea, unspecified: Secondary | ICD-10-CM | POA: Diagnosis present

## 2015-04-27 DIAGNOSIS — I10 Essential (primary) hypertension: Secondary | ICD-10-CM | POA: Diagnosis present

## 2015-04-27 DIAGNOSIS — G459 Transient cerebral ischemic attack, unspecified: Secondary | ICD-10-CM | POA: Diagnosis present

## 2015-04-27 DIAGNOSIS — I25119 Atherosclerotic heart disease of native coronary artery with unspecified angina pectoris: Secondary | ICD-10-CM | POA: Diagnosis present

## 2015-04-27 DIAGNOSIS — E669 Obesity, unspecified: Secondary | ICD-10-CM | POA: Diagnosis present

## 2015-04-27 DIAGNOSIS — I200 Unstable angina: Secondary | ICD-10-CM

## 2015-04-27 DIAGNOSIS — E119 Type 2 diabetes mellitus without complications: Secondary | ICD-10-CM

## 2015-04-27 LAB — GLUCOSE, CAPILLARY: Glucose-Capillary: 170 mg/dL — ABNORMAL HIGH (ref 65–99)

## 2015-04-27 LAB — BASIC METABOLIC PANEL
Anion gap: 8 (ref 5–15)
BUN: 14 mg/dL (ref 6–20)
CO2: 24 mmol/L (ref 22–32)
Calcium: 8.8 mg/dL — ABNORMAL LOW (ref 8.9–10.3)
Chloride: 107 mmol/L (ref 101–111)
Creatinine, Ser: 1.01 mg/dL (ref 0.61–1.24)
GFR calc Af Amer: >60 mL/min (ref >60)
GFR calc non Af Amer: >60 mL/min (ref >60)
Glucose, Bld: 184 mg/dL — ABNORMAL HIGH (ref 65–99)
Potassium: 4 mmol/L (ref 3.5–5.1)
Sodium: 139 mmol/L (ref 135–145)

## 2015-04-27 LAB — CBC
HCT: 35.9 % — ABNORMAL LOW (ref 39.0–52.0)
Hemoglobin: 12 g/dL — ABNORMAL LOW (ref 13.0–17.0)
MCH: 28.8 pg (ref 26.0–34.0)
MCHC: 33.4 g/dL (ref 30.0–36.0)
MCV: 86.3 fL (ref 78.0–100.0)
Platelets: 192 10*3/uL (ref 150–400)
RBC: 4.16 MIL/uL — ABNORMAL LOW (ref 4.22–5.81)
RDW: 13.7 % (ref 11.5–15.5)
WBC: 6.1 10*3/uL (ref 4.0–10.5)

## 2015-04-27 LAB — HEMOGLOBIN A1C
Hgb A1c MFr Bld: 8.6 % — ABNORMAL HIGH (ref 4.8–5.6)
Mean Plasma Glucose: 200 mg/dL

## 2015-04-27 MED ORDER — ASPIRIN 81 MG PO TBEC: 81.0000 mg | DELAYED_RELEASE_TABLET | Freq: Every day | ORAL | Status: DC

## 2015-04-27 MED ORDER — NITROGLYCERIN 0.4 MG SL SUBL: 0.4000 mg | SUBLINGUAL_TABLET | SUBLINGUAL | Status: DC | PRN

## 2015-04-27 MED ORDER — TICAGRELOR 90 MG PO TABS: 90.0000 mg | ORAL_TABLET | Freq: Two times a day (BID) | ORAL | Status: DC

## 2015-04-27 NOTE — Progress Notes (Signed)
CARDIAC REHAB PHASE I   PRE:  Rate/Rhythm: 66 SR    BP: sitting 155/88    SaO2:   MODE:  Ambulation: 1300 ft   POST:  Rate/Rhythm: 78 SR    BP: sitting 161/88     SaO2:   Tolerated well with quick pace. Sts he feels much better, much more energy. Ed completed with pt and wife. Voiced understanding and requests his referral be sent to [facility]. Understands importance of Brilinta/ASA. [place], ACSM 04/27/2015 9:56 AM

## 2015-04-27 NOTE — Discharge Summary (Signed)
Discharge Summary    Patient ID: Jesus Stone,  MRN: [id number], DOB/AGE: 05-24-1955 60 y.o.  Admit date: 04/25/2015 Discharge date: 04/27/2015  Primary Care Provider: GATES,ROBERT NEVILL Primary Cardiologist: Dr. Tamala Julian   Discharge Diagnoses    Principal Problem:   Unstable angina [facility]) Active Problems:   HLD (hyperlipidemia)   Sleep apnea   Crohn's disease ([place])   HTN (hypertension)   TIA (transient ischemic attack)   Diabetes mellitus, type 2 (HCC)   Obesity   CAD (coronary artery disease)   Allergies Allergies  Allergen Reactions  . Lipitor [Atorvastatin]   . Simvastatin     Memory loss     History of Present Illness     Jesus Stone is a 60 y.o. male with a history of HLD, HTN, mild OSA, DMT2, obesity, Crohns disease, TIA and CAD s/p BMS x2 to ramus in 2000 who presented to [facility] today with chest pain.   Per patient history ( no notes in Epic) he presented in 2000 with chest pressure and left arm pain that woke him from sleep. He had a stress test which was basically normal (mild abnormality) and followed up with Dr. Martinique in the office. He continued to have symptoms and was finally set up for cath which showed two blockages per patient. He said one was unable to be treated but he had PCTA to ramus. He did well for 6 weeks and then started having symptoms again with fatigue and had a relook cath and subsequent stenting to x2 to ramus with a 3.0 x 70mm and a 3.0 x 59mm Tristar stents. Caths were done by Dr. Martinique but he was subsequently followed by Dr. Tamala Julian. He was doing so well that it was felt he could follow PRN.   He was in his usual state of health until this past week when he started noting chest squeezing that radiated to his left neck and arm. This was made worse with stress. He also had worsening SOB and fatigue recently over the past several months. This has been constant and he finally called his PCP who advised him to go to the ER.  He presented to  [facility] ER and cardiology was consulted. He ruled out for MI but his symptoms were concerning for [place] and was set up for coronary angiography. He underwent LHC on 04/26/15 which revealed 20% mRCA stenosis, 20% prox LAD stenosis, 80% late restenosis of ramus intermediate s/p successful PCI with DES, and normal LV function. He was kept overnight for observation and is feeling much improved with no chest pain or SOB. He was discharged on ASA and Brilinta.   Hospital Course     Consultants: none  CAD/Unstable angina: chest pain similar to previous anginal pain requiring stenting in the setting of previous normal stress test.He ruled out for MI but it was felt like repeat coronary angiography was indicated. He underwent LHC on 04/26/15 which revealed 20% mRCA stenosis, 20% prox LAD stenosis, 80% late restenosis of ramus intermediate s/p successful PCI with DES, normal LV function.  -- Continue ASA/Brilinta, statin and BB.  HTN: BP mildly elevated today. Continue irbesartan-HCTZ 150-12.5 mg and metoprolol 25mg  BID. Cannot increase BB due to HR in 50-60s. If BP remains elevated in the office next week, I will titrate meds.   DMT2: HgA1c 8.2. He will need to work with his PCP for better control of his diabetes. Hold Metformin 48 hours post cath.   HLD: continue statin    The  patient has had an uncomplicated hospital course and is recovering well. The radial catheter site is stable He has been seen by Dr. Johnsie Cancel today and deemed ready for discharge home. All follow-up appointments have been scheduled. A written RX for a 30 day free supply of Brilinta was provided for the patient. A work excuse note was provided as well. Discharge medications are listed below.  _____________  Discharge Vitals Blood pressure 155/69, pulse 61, temperature 98.5 F (36.9 C), temperature source Oral, resp. rate 16, height 5\' 11"  (1.803 m), weight 242 lb 8.1 oz (110 kg), SpO2 99 %.  Filed Weights   04/25/15 2327 04/26/15 0000  04/27/15 0354  Weight: 240 lb (108.863 kg) 240 lb 15.4 oz (109.3 kg) 242 lb 8.1 oz (110 kg)   General: Well developed, well nourished, male in no acute distress obese Head: Eyes PERRLA, No xanthomas. Normocephalic and atraumatic, oropharynx without edema or exudate. :  Lungs: ctab Heart: HRRR S1 S2, no rub/gallop, Heart regular rate and rhythm with S1, S2 murmur. pulses are 2+ extrem.  Neck: No carotid bruits. No lymphadenopathy. No JVD. Abdomen: Bowel sounds present, abdomen soft and non-tender without masses or hernias noted. Msk: No spine or cva tenderness. No weakness, no joint deformities or effusions. Extremities: No clubbing or cyanosis. No edema.  Neuro: Alert and oriented X 3. No focal deficits noted. Psych: Good affect, responds appropriately Skin: No rashes or lesions noted.   Labs & Radiologic Studies     CBC  Recent Labs  04/26/15 0222 04/27/15 0341  WBC 6.6 6.1  HGB 12.5* 12.0*  HCT 37.1* 35.9*  MCV 86.1 86.3  PLT 189 [id number]   Basic Metabolic Panel  Recent Labs  04/26/15 0222 04/27/15 0341  NA 139 139  K 3.7 4.0  CL 104 107  CO2 25 24  GLUCOSE 181* 184*  BUN 19 14  CREATININE 1.17 1.01  CALCIUM 9.2 8.8*   Liver Function Tests  Recent Labs  04/26/15 0222  AST 50*  ALT 39  ALKPHOS 44  BILITOT 0.6  PROT 6.8  ALBUMIN 3.6   No results for input(s): LIPASE, AMYLASE in the last 72 hours. Cardiac Enzymes  Recent Labs  04/26/15 0222 04/26/15 0748 04/26/15 1541  TROPONINI <0.03 <0.03 <0.03   BNP Invalid input(s): POCBNP D-Dimer No results for input(s): DDIMER in the last 72 hours. Hemoglobin A1C  Recent Labs  04/26/15 0240  HGBA1C 8.6*   Fasting Lipid Panel  Recent Labs  04/26/15 0222  CHOL 98  HDL 30*  LDLCALC 24  TRIG 222*  CHOLHDL 3.3   Thyroid Function Tests No results for input(s): TSH, T4TOTAL, T3FREE, THYROIDAB in the last 72 hours.  Invalid input(s): FREET3  Dg Chest 2 View  04/25/2015  CLINICAL DATA:   Left-sided chest pain and shortness of breath for 3 days, initial encounter EXAM: CHEST  2 VIEW COMPARISON:  11/23/2013 FINDINGS: The heart size and mediastinal contours are within normal limits. Both lungs are clear. The visualized skeletal structures are unremarkable. IMPRESSION: No active cardiopulmonary disease. Electronically Signed   By: Inez Catalina M.D.   On: 04/25/2015 13:32     Diagnostic Studies/Procedures  04/26/15 Conclusion     Mid RCA lesion, 20% stenosed.  Prox LAD lesion, 20% stenosed.  The left ventricular systolic function is normal.  Ost Ramus to Ramus lesion, 80% stenosed. Post intervention, there is a 0% residual stenosis. The lesion was previously treated with a bare metal stent greater than two years ago.  1. Single vessel obstructive CAD with late restenosis of the ramus intermediate branch. 2. Normal LV function 3. Successful stenting of the ramus intermediate with a DES.  Plan: DAPT for one year. Anticipate DC in am.    _____________    Disposition   Pt is being discharged home today in good condition.  Follow-up Plans & Appointments    Follow-up Information    Follow up with Eileen Stanford, PA-C On 05/03/2015.   Specialties:  Cardiology, Radiology   Why:  The office will call you to tell you the time of the appoiintment. , If you do not hear from them, please contact them.   Contact information:   [place] [address] [phone]       Follow up with Henrine Screws, MD.   Specialty:  Internal Medicine   Why:  Please make an appointment with your PCP about your diabetes   Contact information:   [address] [place] [address] [phone]      Discharge Instructions    AMB Referral to Cardiac Rehabilitation - Phase II    Complete by:  As directed   Diagnosis:  PCI           Discharge Medications   Current Discharge Medication List    START taking these medications    Details  nitroGLYCERIN (NITROSTAT) 0.4 MG SL tablet Place 1 tablet (0.4 mg total) under the tongue every 5 (five) minutes as needed for chest pain (CP or SOB). Qty: 25 tablet, Refills: 12    ticagrelor (BRILINTA) 90 MG TABS tablet Take 1 tablet (90 mg total) by mouth 2 (two) times daily. Qty: 180 tablet, Refills: 6      CONTINUE these medications which have CHANGED   Details  aspirin EC 81 MG EC tablet Take 1 tablet (81 mg total) by mouth daily. Qty: 30 tablet      CONTINUE these medications which have NOT CHANGED   Details  Alpha-D-Galactosidase (BEANO) TABS Take 2 tablets by mouth every morning.    APRISO 0.375 g 24 hr capsule Take 1,500 mg by mouth daily.     Cholecalciferol (VITAMIN D3) 2000 UNITS TABS Take 1 capsule by mouth 2 (two) times daily.     mercaptopurine (PURINETHOL) 50 MG tablet Take 1.5 tablets by mouth daily.     metFORMIN (GLUCOPHAGE) 500 MG tablet Take 500 mg by mouth 2 (two) times daily with a meal.     metoprolol tartrate (LOPRESSOR) 25 MG tablet Take 25 mg by mouth 2 (two) times daily.     Omega-3 1000 MG CAPS Take 1 capsule by mouth 2 (two) times daily.     rosuvastatin (CRESTOR) 20 MG tablet Take 20 mg by mouth daily.    TRICOR 145 MG tablet Take 1 tablet by mouth daily.    valsartan-hydrochlorothiazide (DIOVAN-HCT) 160-12.5 MG per tablet Take 1 tablet by mouth daily.           Outstanding Labs/Studies   None  Duration of Discharge Encounter   Greater than 30 minutes including physician time.  SignedAngelena Form R PA-C 04/27/2015, 8:58 AM   Patient examined chart reviewed Ok to d/c home right wrist A Reintervention to RAMUS DAT for a year    Jesus Stone

## 2015-04-27 NOTE — Discharge Instructions (Signed)

## 2015-04-29 ENCOUNTER — Encounter (HOSPITAL_COMMUNITY): Payer: Self-pay | Admitting: Cardiology

## 2015-05-01 NOTE — Progress Notes (Signed)
Cardiology Office Note   Date:  05/03/2015   ID:  RICKO MACDONELL, DOB [birth year], MRN [id number]  PCP:  Henrine Screws, MD  Cardiologist:  Dr. Tamala Julian    Post hospital f/u    History of Present Illness: Jesus Stone is a 60 y.o. male  with a history of HLD, HTN, mild OSA, DMT2, obesity, Crohns disease, TIA and CAD s/p BMS x2 to ramus in 2000 who presents to clinic for post hospital f/u after a recent admission for [place] s/p DES to the ramus.  Per patient history ( no notes in Epic) he presented in 2000 with chest pressure and left arm pain that woke him from sleep. He was admitted and had an inpatient stress test which was mildly abnormal and followed up with Dr. Martinique in the office. He continued to have symptoms and was finally set up for cath which showed two blockages per patient. He said one was unable to be treated but he had PCTA to ramus. He did well for 6 weeks and then started having symptoms again with fatigue and had a relook cath and subsequent stenting to x2 to ramus with a 3.0 x 43mm and a 3.0 x 64mm [employer] stents (patient has stent cards). Caths were done by Dr. Martinique but he was subsequently followed by Dr. Tamala Julian. He was doing so well that it was felt he could follow PRN.   He was in his usual state of health until mid March when he started noting chest squeezing that radiated to his left neck and arm. This was made worse with stress. He also had worsening SOB and fatigue recently over the past several months. This has been constant and he finally called his PCP who advised him to go to the ER.  He presented to [facility] ER 04/25/15 and cardiology was consulted. He ruled out for MI but his symptoms were concerning for [place] and was set up for coronary angiography. He underwent LHC on 04/26/15 which revealed 20% mRCA stenosis, 20% prox LAD stenosis, 80% late restenosis of ramus intermediate s/p successful PCI with DES, and normal LV function. He was kept overnight for observation  and is feeling much improved with no chest pain or SOB. He was discharged on ASA and Brilinta.   Today he presents to clinic for follow up. He has been walking 2-3 miles a day. He has had some deep chest pains and some SOB. He does not know if this related to the Brilinta or not. The chest pain is made worse with certain arm movements.  Neither are similar to his symptoms that brought him to the hospital. He has been waking up at night with numbness and night sweats and has not been sleeping well. He has had some dizziness but no syncope. The dizziness is just random and comes and goes. No blood in his stool or urine. No LE edema, orthopnea or PND. He has drastically changed his diet and reduced the amount of sugar. He is a diabetic and Dr. Inda Merlin think that his BG has gone too low. Dr. Inda Merlin felt he should stay out of work 1 week longer.    Past Medical History  Diagnosis Date  . HLD (hyperlipidemia)   . CAD (coronary artery disease)     a. BMSx2 to ramus 2000  b. [place] s/p DES to ramus for instent restenosis 04/26/15  . Crohn's disease ([place])   . Sleep apnea   . Obesity   . Anal polyp   .  Bell's palsy   . Thyroid nodule   . TIA (transient ischemic attack)   . Acute sinusitis, unspecified   . Gout, unspecified   . Unspecified vitamin D deficiency   . Periodic limb movement disorder   . Nontoxic multinodular goiter   . HTN (hypertension)   . Diabetes mellitus, type 2 [facility])     Past Surgical History  Procedure Laterality Date  . Appendectomy  1964  . Testicle surgery  1967  . Coronary artery stent placment  03/1998  . Rectal polypectomy  2008, 2009  . Heel spur excision Right 2008  . Cardiac catheterization N/A 04/26/2015    Procedure: Left Heart Cath and Coronary Angiography;  Surgeon: Peter M Martinique, MD;  Location: [facility];  Service: Cardiovascular;  Laterality: N/A;  . Cardiac catheterization N/A 04/26/2015    Procedure: Intravascular Pressure Wire/FFR Study;  Surgeon: Peter  M Martinique, MD;  Location: [place];  Service: Cardiovascular;  Laterality: N/A;  . Cardiac catheterization N/A 04/26/2015    Procedure: Coronary Stent Intervention;  Surgeon: Peter M Martinique, MD;  Location: [facility];  Service: Cardiovascular;  Laterality: N/A;     Current Outpatient Prescriptions  Medication Sig Dispense Refill  . Alpha-D-Galactosidase (BEANO) TABS Take 2 tablets by mouth every morning.    . APRISO 0.375 g 24 hr capsule Take 1,500 mg by mouth daily.     Marland Kitchen aspirin EC 81 MG EC tablet Take 1 tablet (81 mg total) by mouth daily. 30 tablet   . Cholecalciferol (VITAMIN D3) 2000 UNITS TABS Take 1 capsule by mouth 2 (two) times daily.     . mercaptopurine (PURINETHOL) 50 MG tablet Take 1.5 tablets by mouth daily.     . metFORMIN (GLUCOPHAGE) 500 MG tablet Take 500 mg by mouth 2 (two) times daily with a meal.     . metoprolol tartrate (LOPRESSOR) 25 MG tablet Take 25 mg by mouth 2 (two) times daily.     . nitroGLYCERIN (NITROSTAT) 0.4 MG SL tablet Place 0.4 mg under the tongue every 5 (five) minutes as needed for chest pain (x 3 doses).    . Omega-3 1000 MG CAPS Take 1 capsule by mouth 2 (two) times daily.     . rosuvastatin (CRESTOR) 20 MG tablet Take 20 mg by mouth daily.    . ticagrelor (BRILINTA) 90 MG TABS tablet Take 1 tablet (90 mg total) by mouth 2 (two) times daily. 180 tablet 6  . TRICOR 145 MG tablet Take 1 tablet by mouth daily.    . valsartan-hydrochlorothiazide (DIOVAN-HCT) 160-12.5 MG per tablet Take 1 tablet by mouth daily.     No current facility-administered medications for this visit.    Allergies:   Lipitor and Simvastatin    Social History:  The patient  reports that he quit smoking about 31 years ago. He does not have any smokeless tobacco history on file. He reports that he does not drink alcohol or use illicit drugs.   Family History:  The patient'sfamily history includes Cancer in his maternal grandmother, mother, and paternal aunt;  Diabetes in his sister.    ROS:  Please see the history of present illness.   Otherwise, review of systems are positive for NONE.   All other systems are reviewed and negative.    PHYSICAL EXAM: VS:  BP 115/68 mmHg  Pulse 64  Ht 5\' 11"  (1.803 m)  Wt 238 lb (107.956 kg)  BMI 33.21 kg/m2 , BMI Body mass  index is 33.21 kg/(m^2). GEN: Well nourished, well developed, in no acute distress HEENT: normal Neck: no JVD, carotid bruits, or masses Cardiac: RRR; no murmurs, rubs, or gallops,no edema  Respiratory:  clear to auscultation bilaterally, normal work of breathing GI: soft, nontender, nondistended, + BS MS: no deformity or atrophy Skin: warm and dry, no rash Neuro:  Strength and sensation are intact Psych: euthymic mood, full affect   EKG:  EKG IS ordered today. NSR HR 64    Recent Labs: 04/26/2015: ALT 39 04/27/2015: BUN 14; Creatinine, Ser 1.01; Hemoglobin 12.0*; Platelets 192; Potassium 4.0; Sodium 139    Lipid Panel    Component Value Date/Time   CHOL 98 04/26/2015 0222   CHOL 147 03/01/2013 1449   TRIG 222* 04/26/2015 0222   HDL 30* 04/26/2015 0222   HDL 40 03/01/2013 1449   CHOLHDL 3.3 04/26/2015 0222   CHOLHDL 3.7 03/01/2013 1449   VLDL 44* 04/26/2015 0222   LDLCALC 24 04/26/2015 0222   LDLCALC 59 03/01/2013 1449      Wt Readings from Last 3 Encounters:  05/03/15 238 lb (107.956 kg)  04/27/15 242 lb 8.1 oz (110 kg)  03/31/13 244 lb (110.678 kg)      Other studies Reviewed: Additional studies/ records that were reviewed today include: LHC. Review of the above records demonstrates:   04/26/15 Conclusion     Mid RCA lesion, 20% stenosed.  Prox LAD lesion, 20% stenosed.  The left ventricular systolic function is normal.  Ost Ramus to Ramus lesion, 80% stenosed. Post intervention, there is a 0% residual stenosis. The lesion was previously treated with a bare metal stent greater than two years ago.  1. Single vessel obstructive CAD with late  restenosis of the ramus intermediate branch. 2. Normal LV function 3. Successful stenting of the ramus intermediate with a DES.  Plan: DAPT for one year. Anticipate DC in am.  ____________       ASSESSMENT AND PLAN: KEBBA BERCIER is a 60 y.o. male  with a history of HLD, HTN, mild OSA, DMT2, obesity, Crohns disease, TIA and CAD s/p BMS x2 to ramus in 2000 who presents to clinic for post hospital f/u after a recent admission for [place] s/p DES to the ramus.  CAD: recent admission for [place]. He underwent LHC on 04/26/15 which revealed 20% mRCA stenosis, 20% prox LAD stenosis, 80% late restenosis of ramus intermediate s/p successful PCI with DES, normal LV function.  -- Continue ASA/Brilinta, statin and BB. He has had some atypical chest pain and SOB with exertion. For that reason I will have him closely followed and seen back in a week. ECG today NSR with no acute ST or TW changes. We discussed how Brilinta can cause SOB and I told him drinking caffiene can sometimes help. This should get better with time. If it doesn't, we could think about switching him to plavix or effient after 1 month.  HTN: BP 115/68. Well controlled on current regimen. Continue irbesartan-HCTZ 150-12.5 mg and metoprolol 25mg  BID.   DMT2: HgA1c 8.2. He will need to work with his PCP for better control of his diabetes.  HLD: continue statin   Current medicines are reviewed at length with the patient today.  The patient does not have concerns regarding medicines.  The following changes have been made:  no change  Labs/ tests ordered today include:   Orders Placed This Encounter  Procedures  . EKG 12-Lead   Disposition:   FU with APP in 1  week   Signed, Crista Luria  05/03/2015 8:43 AM    [facility] [address], [place], [place]  [address] Phone: [phone]; Fax: [phone]

## 2015-05-03 ENCOUNTER — Ambulatory Visit (INDEPENDENT_AMBULATORY_CARE_PROVIDER_SITE_OTHER): Payer: 59 | Admitting: Physician Assistant

## 2015-05-03 ENCOUNTER — Encounter: Payer: Self-pay | Admitting: Physician Assistant

## 2015-05-03 VITALS — BP 115/68 | HR 64 | Ht 71.0 in | Wt 238.0 lb

## 2015-05-03 DIAGNOSIS — I200 Unstable angina: Secondary | ICD-10-CM | POA: Diagnosis not present

## 2015-05-03 DIAGNOSIS — I10 Essential (primary) hypertension: Secondary | ICD-10-CM | POA: Diagnosis not present

## 2015-05-03 DIAGNOSIS — I2510 Atherosclerotic heart disease of native coronary artery without angina pectoris: Secondary | ICD-10-CM | POA: Diagnosis not present

## 2015-05-03 NOTE — Patient Instructions (Signed)
Medication Instructions:  Your physician recommends that you continue on your current medications as directed. Please refer to the Current Medication list given to you today.   Labwork: None ordered  Testing/Procedures: None ordered  Follow-Up: Your physician recommends that you schedule a follow-up appointment in: 1 WEEK WITH EXTENDER (FLEX WILL BE FINE)   Any Other Special Instructions Will Be Listed Below (If Applicable).     If you need a refill on your cardiac medications before your next appointment, please call your pharmacy.

## 2015-05-08 ENCOUNTER — Encounter: Payer: 59 | Attending: Internal Medicine | Admitting: *Deleted

## 2015-05-08 ENCOUNTER — Encounter: Payer: Self-pay | Admitting: *Deleted

## 2015-05-08 VITALS — Ht 71.0 in | Wt 238.5 lb

## 2015-05-08 DIAGNOSIS — E119 Type 2 diabetes mellitus without complications: Secondary | ICD-10-CM | POA: Diagnosis present

## 2015-05-08 NOTE — Patient Instructions (Signed)
Plan:  Aim for 3-4 Carb Choices per meal (45-60 grams) +/- 1 either way  Aim for 0-15 Carbs per snack if hungry  Include protein in moderation with your meals and snacks Consider reading food labels for Total Carbohydrate and Fat Grams of foods Consider  increasing your activity level by being active 150 minutes weekly as tolerated Consider checking BG at alternate times per day to include fasting and 2 hours after your meal.  Continue taking medication as directed by MD  Balance carbs and protein in all meals and snacks Consider Good choices and portion control.  Consider a 9 in plate. Don't go back for seconds. Am I hungry? Or was it really good and I want more  Beans, Beans, good for your heart, the more you eat ............  Jesus Stone it has been a pleasure working with you today.

## 2015-05-08 NOTE — Progress Notes (Signed)
Diabetes Self-Management Education  Visit Type: First/Initial  Appt. Start Time: 1045 Appt. End Time: 1215  05/08/2015  Mr. Jesus Stone, identified by name and date of birth, is a 60 y.o. male with a diagnosis of Diabetes: Type 2. Mr. Jesus Stone presents for Diabetes Self Management Education. He has been recently been hospitalized for cardiac concerns. He is presently out of work recovering. He is married, works full time and plans to retire in the next couple of years. His wife runs a home daycare center. They have two grown children and 2 grandchildren Jesus Amsterdam has experienced some episode of dizzy and shaky which may have been periods of hypoglycemia. At his physician's recommendation he will begin testing his glucose regularly. Blood Glucose Monitoring Instruction  ASSESSMENT  Height 5' 11"  (1.803 m), weight 238 lb 8 oz (108.183 kg). Body mass index is 33.28 kg/(m^2).      Diabetes Self-Management Education - 05/08/15 1254    Visit Information   Visit Type First/Initial   Initial Visit   Diabetes Type Type 2   Are you currently following a meal plan? No   Are you taking your medications as prescribed? Yes   Date Diagnosed 2016   Health Coping   How would you rate your overall health? Good   Psychosocial Assessment   Patient Belief/Attitude about Diabetes Motivated to manage diabetes   Self-care barriers None   Self-management support Doctor's office;Family;CDE visits   Other persons present Patient   Patient Concerns Nutrition/Meal planning;Medication;Monitoring;Healthy Lifestyle;Problem Solving;Glycemic Control;Weight Control;Support   Special Needs None   Preferred Learning Style No preference indicated   Learning Readiness Change in progress   How often do you need to have someone help you when you read instructions, pamphlets, or other written materials from your doctor or pharmacy? 2 - Rarely   Complications   Last HgB A1C per patient/outside source 8.6 %   How often do you  check your blood sugar? Not recommended by provider   Have you had a dilated eye exam in the past 12 months? Yes   Have you had a dental exam in the past 12 months? Yes   Are you checking your feet? Yes   How many days per week are you checking your feet? 7   Dietary Intake   Breakfast rolled oats, strawberries, pecans, banana, agave   Lunch salad, peppers, romaine lettuce, carrotts, strawberries, tomatoes, sun dried tomatoes, 1/2 tbsp blue cheese dressing   Snack (afternoon) peanuts   Dinner veggies/beans, met   Snack (evening) cherry tomatoes   Beverage(s) water, ice tea 1/2 & 1/2, coffee 1/2 & 1/2 caffeine   Exercise   Exercise Type ADL's   Patient Education   Previous Diabetes Education No   Disease state  Definition of diabetes, type 1 and 2, and the diagnosis of diabetes;Factors that contribute to the development of diabetes   Nutrition management  Role of diet in the treatment of diabetes and the relationship between the three main macronutrients and blood glucose level;Food label reading, portion sizes and measuring food.;Carbohydrate counting;Information on hints to eating out and maintain blood glucose control.;Meal options for control of blood glucose level and chronic complications.   Physical activity and exercise  Role of exercise on diabetes management, blood pressure control and cardiac health.   Monitoring Taught/evaluated SMBG meter.;Taught/discussed recording of test results and interpretation of SMBG.   Chronic complications Relationship between chronic complications and blood glucose control;Identified and discussed with patient  current chronic complications   Psychosocial adjustment  Role of stress on diabetes;Identified and addressed patients feelings and concerns about diabetes;Helped patient identify a support system for diabetes management   Personal strategies to promote health Lifestyle issues that need to be addressed for better diabetes care   Individualized Goals  (developed by patient)   Nutrition General guidelines for healthy choices and portions discussed   Physical Activity Exercise 3-5 times per week;30 minutes per day   Medications take my medication as prescribed   Monitoring  test my blood glucose as discussed   Reducing Risk examine blood glucose patterns;increase portions of nuts and seeds;treat hypoglycemia with 15 grams of carbs if blood glucose less than 69m/dL   Outcomes   Expected Outcomes Demonstrated interest in learning. Expect positive outcomes   Future DMSE Yearly   Program Status Completed      Meter Provided: One Touch Verio Flex  Lot #:  [id number] Expiration Date:4/31/2018 Glucose 3hpp 1213mdl   Intervention:    Explained rationale of testing BG to obtain data as to how their diabetes is being managed.  Taught patient techniques for using BG monitor and lancing device  Discussed need for Rx for strips and lancets   Explained rationale of recording BG both for patient and MD to assess patterns as needed.  Individualized Plan for Diabetes Self-Management Training:   Learning Objective:  Patient will have a greater understanding of diabetes self-management. Patient education plan is to attend individual and/or group sessions per assessed needs and concerns.   Plan:   Patient Instructions  Plan:  Aim for 3-4 Carb Choices per meal (45-60 grams) +/- 1 either way  Aim for 0-15 Carbs per snack if hungry  Include protein in moderation with your meals and snacks Consider reading food labels for Total Carbohydrate and Fat Grams of foods Consider  increasing your activity level by being active 150 minutes weekly as tolerated Consider checking BG at alternate times per day to include fasting and 2 hours after your meal.  Continue taking medication as directed by MD  Balance carbs and protein in all meals and snacks Consider Good choices and portion control.  Consider a 9 in plate. Don't go back for seconds. Am I  hungry? Or was it really good and I want more  Beans, Beans, good for your heart, the more you eat ............  PhAbbe Stone has been a pleasure working with you today.    Expected Outcomes:  Demonstrated interest in learning. Expect positive outcomes  Education material provided: Living Well with Diabetes, A1C conversion sheet, Meal plan card, My Plate and Support group flyer  If problems or questions, patient to contact team via:  Phone  Future DSME appointment: Yearly

## 2015-05-10 ENCOUNTER — Ambulatory Visit (INDEPENDENT_AMBULATORY_CARE_PROVIDER_SITE_OTHER): Payer: 59 | Admitting: Cardiology

## 2015-05-10 ENCOUNTER — Encounter: Payer: Self-pay | Admitting: Cardiology

## 2015-05-10 VITALS — BP 112/68 | HR 73 | Ht 71.0 in | Wt 239.0 lb

## 2015-05-10 DIAGNOSIS — I2510 Atherosclerotic heart disease of native coronary artery without angina pectoris: Secondary | ICD-10-CM

## 2015-05-10 NOTE — Progress Notes (Signed)
05/10/2015 Jesus Stone   1955/08/07  [id number]  Primary Physician Henrine Screws, MD Primary Cardiologist: Dr. Tamala Julian   Reason for Visit/CC: F/u for CAD  HPI:  Jesus Stone is a 60 y.o. male with a history of HLD, HTN, mild OSA, DMT2, obesity, Crohns disease, TIA and CAD s/p BMS x2 to ramus in 2000. He was recently admitted to [facility] for unstable angina and underwent LHC on 04/26/15 which revealed 20% mRCA stenosis, 20% prox LAD stenosis, 80% late restenosis of ramus intermediate s/p successful PCI with DES, normal LV function. He was placed on DAPT with ASA + Brilinta, a statin and a BB.   He was was seen 05/03/15 for post hospital f/u. At that time, he complained of atypical CP and SOB which was thought possibly related to [place]. His EKG showed NSR w/o ST or TW changes. It was discussed with him the potential side effect of dyspnea from Brilinta use. He was encouraged to try caffeine to see if it would help and to give it time. However, given his complaints, he was ordered to f/u in 1 week for close reassessment of symptoms.   Course he continues to have episodic bouts of mild dyspnea but only lasts for several seconds to 1-2 minutes. He also continues to have atypical chest discomfort. It is not worsened by exertion. Occurs off and on throughout the day with peptic or pattern. It is unlike the anginal symptoms he experienced the past. He is currently chest pain-free. He denies exertional dyspnea, lower 70 edema, orthopnea or PND. He reports full medication compliance. He denies any abnormal bleeding with dual antiplatelet therapy. We discussed several options including further titration of his meds including upward titration of his Metroprolol versus addition of low-dose Imdur. However he does not feel that his symptoms are cardiac and he wants to give it a little bit more time to see if his dyspnea improves.   Current Outpatient Prescriptions  Medication Sig Dispense Refill  .  Alpha-D-Galactosidase (BEANO) TABS Take 2 tablets by mouth every morning.    . APRISO 0.375 g 24 hr capsule Take 1,500 mg by mouth daily.     Marland Kitchen aspirin EC 81 MG EC tablet Take 1 tablet (81 mg total) by mouth daily. 30 tablet   . Cholecalciferol (VITAMIN D3) 2000 UNITS TABS Take 1 capsule by mouth 2 (two) times daily.     . mercaptopurine (PURINETHOL) 50 MG tablet Take 1.5 tablets by mouth daily.     . metFORMIN (GLUCOPHAGE) 500 MG tablet Take 250-500 mg by mouth 2 (two) times daily with a meal. Take 500mg  in the morning and 250mg  in the evening    . metoprolol tartrate (LOPRESSOR) 25 MG tablet Take 25 mg by mouth 2 (two) times daily.     . nitroGLYCERIN (NITROSTAT) 0.4 MG SL tablet Place 0.4 mg under the tongue every 5 (five) minutes as needed for chest pain (x 3 doses).    . Omega-3 1000 MG CAPS Take 1 capsule by mouth 2 (two) times daily.     . rosuvastatin (CRESTOR) 20 MG tablet Take 20 mg by mouth daily.    . ticagrelor (BRILINTA) 90 MG TABS tablet Take 1 tablet (90 mg total) by mouth 2 (two) times daily. 180 tablet 6  . TRICOR 145 MG tablet Take 1 tablet by mouth daily.    . valsartan-hydrochlorothiazide (DIOVAN-HCT) 160-12.5 MG per tablet Take 1 tablet by mouth daily.     No current facility-administered medications for  this visit.    Allergies  Allergen Reactions  . Lipitor [Atorvastatin] Other (See Comments)    Memory loss  . Simvastatin     Memory loss    Social History   Social History  . Marital Status: Married    Spouse Name: sharon  . Number of Children: 2  . Years of Education: college   Occupational History  . [employer]    Social History Main Topics  . Smoking status: Former Smoker    Quit date: 03/01/1984  . Smokeless tobacco: Not on file  . Alcohol Use: No  . Drug Use: No  . Sexual Activity: Yes   Other Topics Concern  . Not on file   Social History Narrative   Right handed male     Review of Systems: General: negative for chills, fever, night  sweats or weight changes.  Cardiovascular: negative for chest pain, dyspnea on exertion, edema, orthopnea, palpitations, paroxysmal nocturnal dyspnea or shortness of breath Dermatological: negative for rash Respiratory: negative for cough or wheezing Urologic: negative for hematuria Abdominal: negative for nausea, vomiting, diarrhea, bright red blood per rectum, melena, or hematemesis Neurologic: negative for visual changes, syncope, or dizziness All other systems reviewed and are otherwise negative except as noted above.    Blood pressure 112/68, pulse 73, height 5\' 11"  (1.803 m), weight 239 lb (108.41 kg).  General appearance: alert, cooperative and no distress Neck: no carotid bruit and no JVD Lungs: clear to auscultation bilaterally Heart: regular rate and rhythm, S1, S2 normal, no murmur, click, rub or gallop Extremities: no LEE Pulses: 2+ and symmetric Skin: warm and dry Neurologic: Grossly normal  EKG not performed  ASSESSMENT AND PLAN:   1. CAD: LHC on 04/26/15 which revealed 20% mRCA stenosis, 20% prox LAD stenosis, 80% late restenosis of ramus intermediate s/p successful PCI with DES, normal LV function. He was placed on DAPT with ASA + Brilinta, a statin and a BB. He has occasional atypical CP unlike his prior anginal symptoms. Also with mild episodic dyspnea, lasting only a brief period of time, felt to be related to Brilinta. The patient is willing to give it a little more time to see if symptoms improve. He understands to call if symptoms fail to improve over the next month or fail to worsen. We will keep current regimen the same.   PLAN  F/u with Dr. Tamala Julian in 4-6 weeks.   Norell Brisbin PA-C 05/10/2015 1:29 PM

## 2015-05-10 NOTE — Patient Instructions (Addendum)
Medication Instructions:  Your physician recommends that you continue on your current medications as directed. Please refer to the Current Medication list given to you today.   Labwork: None ordered  Testing/Procedures: None ordered  Follow-Up: Your physician recommends that you schedule a follow-up appointment in: 4-6 WEEKS WITH DR. SMITH   Any Other Special Instructions Will Be Listed Below (If Applicable).     If you need a refill on your cardiac medications before your next appointment, please call your pharmacy.   

## 2015-05-16 ENCOUNTER — Encounter: Payer: Self-pay | Admitting: Physician Assistant

## 2015-05-30 ENCOUNTER — Other Ambulatory Visit: Payer: Self-pay | Admitting: *Deleted

## 2015-05-30 MED ORDER — TICAGRELOR 90 MG PO TABS: 90.0000 mg | ORAL_TABLET | Freq: Two times a day (BID) | ORAL | Status: DC

## 2015-06-05 ENCOUNTER — Telehealth: Payer: Self-pay | Admitting: Interventional Cardiology

## 2015-06-05 MED ORDER — CLOPIDOGREL BISULFATE 75 MG PO TABS: 75.0000 mg | ORAL_TABLET | Freq: Every day | ORAL | Status: DC

## 2015-06-05 NOTE — Telephone Encounter (Signed)
The symptoms are probably related to Brilinta. Recommend that he be given Plavix 75 mg tablets. He should take 2 tablets with his next Brilinta (which will be his last dose). Thereafter Plavix 75 mg daily

## 2015-06-05 NOTE — Telephone Encounter (Signed)
I spoke with pt and gave him instructions form Dr. Tamala Julian. Will send prescription to [facility] in [place]. Pt will let us know if symptoms do not improve after making change.

## 2015-06-05 NOTE — Telephone Encounter (Signed)
Pt calling c/o dizzy, lightheadedness, SOB, numbness around mouth, feels off balance about 3 weeks, recently started [place] 04-26-15-pls advise [phone]

## 2015-06-05 NOTE — Telephone Encounter (Signed)
Spoke with pt. He reports the symptoms below for about the last 3 weeks. Also states he has been having headaches for the last 3 weeks.  Dizziness and light headedness happen several times per week. Lasts 30 seconds to 5 minutes. Not related to change in position. Comes on suddenly. He stops and catches himself and sits down or holds onto a chair. Has not fallen. The feeling of being off balance happens a couple of times a week. Not as often as dizziness. Feels he tips to one side when walking.  Numbness around mouth comes and goes. Lasts a couple of minutes.  Last episode was last night. Shortness of breath happens daily. Lasts 5-10 minutes. Comes on suddenly. Not related to activity. No chest pain. No numbness of legs or arms.  No new medications. Metformin recently decreased by primary care. Pt reports blood sugar is well controlled with this change. Will forward to Dr. Tamala Julian for review and recommendations.

## 2015-06-10 ENCOUNTER — Telehealth: Payer: Self-pay | Admitting: Interventional Cardiology

## 2015-06-10 NOTE — Telephone Encounter (Signed)
Returned pt call. Pt sts that he is still experiencing dizziness, sob, feeling light headed, and numbness around his mouth. Pt sts that he took his last dose of Brilinta on 5/3 and started Plavix the same day. He has not had any improvement in his symptoms. Pt sts that he had an episode of pre-syncope on 06/07/15. He was awoke this morning around 2 am with numbness around his mouth. Adv pt that I will update Dr.Smith and call back with his recommendation.

## 2015-06-10 NOTE — Telephone Encounter (Signed)
Pt aware of Dr.Smith's response. Dr.Smith doesn't not fell that the pt's symptoms are related to the medication, and that they are not cardiac related. Dr.Smith recommends pt f/u with his pcp. Pt agreeable and verbalized understanding.

## 2015-06-10 NOTE — Telephone Encounter (Signed)
New Message  Pt stated that since speaking w/ RN last week- symptoms have not changed- dizziness, SOB, lightheaded, nubmness- pt stated that on 5/5 he said he was close to passing out- but did not completely lose conscience. Please call back and discuss.

## 2015-07-07 NOTE — Progress Notes (Signed)
Cardiology Office Note    Date:  07/08/2015   ID:  Jesus Stone, Jesus Stone, Jesus Stone, MRN [id number]  PCP:  Henrine Screws, MD  Cardiologist: Sinclair Grooms, MD   Chief Complaint  Patient presents with  . Coronary Artery Disease  . Follow-up    Htn    History of Present Illness:  Jesus Stone is a 60 y.o. male follow-up of coronary artery disease, hyperlipidemia, hypertension, and type 2 diabetes.  Patient is doing well. He presented in March with progressive angina on exertion. Recathed demonstrated in-stent restenosis involving overlapping bare-metal stents in the ramus intermedius performed in 2000. He underwent repeat PCI with DES implantation. Symptoms have resolved. No complications on the current medical regimen.  Past Medical History  Diagnosis Date  . HLD (hyperlipidemia)   . CAD (coronary artery disease)     a. BMSx2 to ramus 2000  b. [place] s/p DES to ramus for instent restenosis 04/26/15  . Crohn's disease ([place])   . Sleep apnea   . Obesity   . Anal polyp   . Bell's palsy   . Thyroid nodule   . TIA (transient ischemic attack)   . Acute sinusitis, unspecified   . Gout, unspecified   . Unspecified vitamin D deficiency   . Periodic limb movement disorder   . Nontoxic multinodular goiter   . HTN (hypertension)   . Diabetes mellitus, type 2 [facility])     Past Surgical History  Procedure Laterality Date  . Appendectomy  1964  . Testicle surgery  1967  . Coronary artery stent placment  03/1998  . Rectal polypectomy  2008, 2009  . Heel spur excision Right 2008  . Cardiac catheterization N/A 04/26/2015    Procedure: Left Heart Cath and Coronary Angiography;  Surgeon: Peter M Martinique, MD;  Location: [facility];  Service: Cardiovascular;  Laterality: N/A;  . Cardiac catheterization N/A 04/26/2015    Procedure: Intravascular Pressure Wire/FFR Study;  Surgeon: Peter M Martinique, MD;  Location: [facility];  Service: Cardiovascular;  Laterality: N/A;  .  Cardiac catheterization N/A 04/26/2015    Procedure: Coronary Stent Intervention;  Surgeon: Peter M Martinique, MD;  Location: [facility];  Service: Cardiovascular;  Laterality: N/A;    Current Medications: Outpatient Prescriptions Prior to Visit  Medication Sig Dispense Refill  . Alpha-D-Galactosidase (BEANO) TABS Take 2 tablets by mouth every morning.    . APRISO 0.375 g 24 hr capsule Take 1,500 mg by mouth daily.     Marland Kitchen aspirin EC 81 MG EC tablet Take 1 tablet (81 mg total) by mouth daily. 30 tablet   . Cholecalciferol (VITAMIN D3) 2000 UNITS TABS Take 1 capsule by mouth 2 (two) times daily.     . clopidogrel (PLAVIX) 75 MG tablet Take 1 tablet (75 mg total) by mouth daily. 30 tablet 6  . mercaptopurine (PURINETHOL) 50 MG tablet Take 1.5 tablets by mouth daily.     . metFORMIN (GLUCOPHAGE) 500 MG tablet Take 250-500 mg by mouth 2 (two) times daily with a meal. Take 500mg  in the morning and 250mg  in the evening    . metoprolol tartrate (LOPRESSOR) 25 MG tablet Take 25 mg by mouth 2 (two) times daily.     . nitroGLYCERIN (NITROSTAT) 0.4 MG SL tablet Place 0.4 mg under the tongue every 5 (five) minutes as needed for chest pain (x 3 doses).    . Omega-3 1000 MG CAPS Take 1 capsule by mouth 2 (two)  times daily.     . rosuvastatin (CRESTOR) 20 MG tablet Take 20 mg by mouth daily.    . TRICOR 145 MG tablet Take 1 tablet by mouth daily.    . valsartan-hydrochlorothiazide (DIOVAN-HCT) 160-12.5 MG per tablet Take 1 tablet by mouth daily.     No facility-administered medications prior to visit.     Allergies:   Lipitor and Simvastatin   Social History   Social History  . Marital Status: Married    Spouse Name: Jesus Stone  . Number of Children: 2  . Years of Education: college   Occupational History  . [employer]    Social History Main Topics  . Smoking status: Former Smoker    Quit date: 03/01/1984  . Smokeless tobacco: Never Used  . Alcohol Use: No  . Drug Use: No  . Sexual  Activity: Yes   Other Topics Concern  . None   Social History Narrative   Right handed male     Family History:  The patient's family history includes Cancer in his maternal grandmother, mother, and paternal aunt; Diabetes in his sister.   ROS:   Please see the history of present illness.    Difficulty sleeping, dyspnea on exertion, difficulty with controlling caloric intake.  All other systems reviewed and are negative.   PHYSICAL EXAM:   VS:  BP 116/64 mmHg  Pulse 54  Ht 5\' 11"  (1.803 m)  Wt 231 lb 6.4 oz (104.962 kg)  BMI 32.29 kg/m2   GEN: Well nourished, well developed, in no acute distress HEENT: normal Neck: no JVD, carotid bruits, or masses Cardiac: RRR; no murmurs, rubs, or gallops,no edema  Respiratory:  clear to auscultation bilaterally, normal work of breathing GI: soft, nontender, nondistended, + BS MS: no deformity or atrophy Skin: warm and dry, no rash Neuro:  Alert and Oriented x 3, Strength and sensation are intact Psych: euthymic mood, full affect  Wt Readings from Last 3 Encounters:  07/08/15 231 lb 6.4 oz (104.962 kg)  05/10/15 239 lb (108.41 kg)  05/08/15 238 lb 8 oz (108.183 kg)      Studies/Labs Reviewed:   EKG:  EKG  Is not repeated.  Recent Labs: 04/26/2015: ALT 39 04/27/2015: BUN 14; Creatinine, Ser 1.01; Hemoglobin 12.0*; Platelets 192; Potassium 4.0; Sodium 139   Lipid Panel    Component Value Date/Time   CHOL 98 04/26/2015 0222   CHOL 147 03/01/2013 1449   TRIG 222* 04/26/2015 0222   HDL 30* 04/26/2015 0222   HDL 40 03/01/2013 1449   CHOLHDL 3.3 04/26/2015 0222   CHOLHDL 3.7 03/01/2013 1449   VLDL 44* 04/26/2015 0222   LDLCALC 24 04/26/2015 0222   LDLCALC 59 03/01/2013 1449    Additional studies/ records that were reviewed today include:  PCI 04/2015 Post-Intervention Diagram                ASSESSMENT:    1. CAD, multiple vessel   2. Essential hypertension   3. HLD (hyperlipidemia)      PLAN:  In order of  problems listed above:  1. I encouraged aerobic activity. Call if recurrent angina. Otherwise will follow-up with me in 9 months at which time Plavix will be discontinued. 2. Low-salt diet and physical activity/exercise are recommended. We discussed his blood pressure target should be 66/62 year old last period 3. We discussed LDL target of 70 or less.    Medication Adjustments/Labs and Tests Ordered: Current medicines are reviewed at length with the patient today.  Concerns  regarding medicines are outlined above.  Medication changes, Labs and Tests ordered today are listed in the Patient Instructions below. Patient Instructions  Medication Instructions:  Your physician recommends that you continue on your current medications as directed. Please refer to the Current Medication list given to you today.   Labwork: None ordered  Testing/Procedures: None ordered  Follow-Up: Your physician wants you to follow-up in: 9 months with Dr.Valentino Saavedra You will receive a reminder letter in the mail two months in advance. If you don't receive a letter, please call our office to schedule the follow-up appointment.   Any Other Special Instructions Will Be Listed Below (If Applicable). Your blood pressure goal is 130/90 or less  Your LDL (bad cholesterol)  Goal is 70 or less     If you need a refill on your cardiac medications before your next appointment, please call your pharmacy.       Signed, Sinclair Grooms, MD  07/08/2015 10:58 AM    [facility] [place], [place], [place]  [address] Phone: [phone]; Fax: [phone]

## 2015-07-08 ENCOUNTER — Encounter: Payer: Self-pay | Admitting: Interventional Cardiology

## 2015-07-08 ENCOUNTER — Ambulatory Visit (INDEPENDENT_AMBULATORY_CARE_PROVIDER_SITE_OTHER): Payer: 59 | Admitting: Interventional Cardiology

## 2015-07-08 VITALS — BP 116/64 | HR 54 | Ht 71.0 in | Wt 231.4 lb

## 2015-07-08 DIAGNOSIS — I10 Essential (primary) hypertension: Secondary | ICD-10-CM | POA: Diagnosis not present

## 2015-07-08 DIAGNOSIS — E785 Hyperlipidemia, unspecified: Secondary | ICD-10-CM

## 2015-07-08 DIAGNOSIS — I2510 Atherosclerotic heart disease of native coronary artery without angina pectoris: Secondary | ICD-10-CM | POA: Diagnosis not present

## 2015-07-08 NOTE — Patient Instructions (Signed)
Medication Instructions:  Your physician recommends that you continue on your current medications as directed. Please refer to the Current Medication list given to you today.   Labwork: None ordered  Testing/Procedures: None ordered  Follow-Up: Your physician wants you to follow-up in: 9 months with Dr.Smith You will receive a reminder letter in the mail two months in advance. If you don't receive a letter, please call our office to schedule the follow-up appointment.   Any Other Special Instructions Will Be Listed Below (If Applicable). Your blood pressure goal is 130/90 or less  Your LDL (bad cholesterol)  Goal is 70 or less     If you need a refill on your cardiac medications before your next appointment, please call your pharmacy.

## 2015-07-17 ENCOUNTER — Other Ambulatory Visit: Payer: Self-pay | Admitting: Interventional Cardiology

## 2015-07-17 MED ORDER — NITROGLYCERIN 0.4 MG SL SUBL: 0.4000 mg | SUBLINGUAL_TABLET | SUBLINGUAL | Status: DC | PRN

## 2015-07-17 MED ORDER — CLOPIDOGREL BISULFATE 75 MG PO TABS: 75.0000 mg | ORAL_TABLET | Freq: Every day | ORAL | Status: DC

## 2015-12-18 ENCOUNTER — Telehealth: Payer: Self-pay | Admitting: Interventional Cardiology

## 2015-12-18 NOTE — Telephone Encounter (Signed)
Request for surgical clearance:  1. What type of surgery is being performed? Colonoscopy   2. When is this surgery scheduled? 02/07/16   3. Are there any medications that need to be held prior to surgery and how long? Plavix- wanted to stop for 3 days   4. Name of physician performing surgery? Dr. Oletta Lamas   5. What is your office phone and fax number? Ph [phone] Fax [phone]

## 2015-12-19 NOTE — Telephone Encounter (Signed)
It is been greater than 6 months since his last stent. It is therefore okay 2 parts Plavix for up to 7 days prior to colonoscopy.

## 2015-12-19 NOTE — Telephone Encounter (Signed)
Fax placed in nurse fax box 

## 2016-02-19 ENCOUNTER — Other Ambulatory Visit: Payer: Self-pay | Admitting: Dermatology

## 2016-05-09 NOTE — Progress Notes (Signed)
Cardiology Office Note    Date:  05/11/2016   ID:  Jesus Stone, DOB Apr 06, 1955, MRN [id number]  PCP:  Henrine Screws, MD  Cardiologist: Sinclair Grooms, MD   Chief Complaint  Patient presents with  . Coronary Artery Disease    History of Present Illness:  Jesus Stone is a 61 y.o. male  presents for follow-up of coronary artery disease with bare-metal stent 2000 and DES 2017 for ISR of ramus, hyperlipidemia, hypertension, obstructive sleep apnea,and type 2 diabetes.  He is doing well. He denies angina and medication side effects. No recurrence of angina which was described as left neck and arm discomfort with exertion and also fatigue. Denies limitations. Not exercising as much as he would like. No medication side effects.    Past Medical History:  Diagnosis Date  . Acute sinusitis, unspecified   . Anal polyp   . Bell's palsy   . CAD (coronary artery disease)    a. BMSx2 to ramus 2000  b. [place] s/p DES to ramus for instent restenosis 04/26/15  . Crohn's disease ([place])   . Diabetes mellitus, type 2 ([place])   . Gout, unspecified   . HLD (hyperlipidemia)   . HTN (hypertension)   . Nontoxic multinodular goiter   . Obesity   . Periodic limb movement disorder   . Sleep apnea   . Thyroid nodule   . TIA (transient ischemic attack)   . Unspecified vitamin D deficiency     Past Surgical History:  Procedure Laterality Date  . APPENDECTOMY  1964  . CARDIAC CATHETERIZATION N/A 04/26/2015   Procedure: Left Heart Cath and Coronary Angiography;  Surgeon: Peter M Martinique, MD;  Location: [facility];  Service: Cardiovascular;  Laterality: N/A;  . CARDIAC CATHETERIZATION N/A 04/26/2015   Procedure: Intravascular Pressure Wire/FFR Study;  Surgeon: Peter M Martinique, MD;  Location: [facility];  Service: Cardiovascular;  Laterality: N/A;  . CARDIAC CATHETERIZATION N/A 04/26/2015   Procedure: Coronary Stent Intervention;  Surgeon: Peter M Martinique, MD;  Location: [facility];  Service: Cardiovascular;  Laterality: N/A;  . Coronary artery stent placment  03/1998  . HEEL SPUR EXCISION Right 2008  . RECTAL POLYPECTOMY  2008, 2009  . TESTICLE SURGERY  1967    Current Medications: Outpatient Medications Prior to Visit  Medication Sig Dispense Refill  . Alpha-D-Galactosidase (BEANO) TABS Take 2 tablets by mouth every morning.    . APRISO 0.375 g 24 hr capsule Take 1,500 mg by mouth daily.     Marland Kitchen aspirin EC 81 MG EC tablet Take 1 tablet (81 mg total) by mouth daily. 30 tablet   . Cholecalciferol (VITAMIN D3) 2000 UNITS TABS Take 1 capsule by mouth 2 (two) times daily.     . clopidogrel (PLAVIX) 75 MG tablet Take 1 tablet (75 mg total) by mouth daily. 90 tablet 3  . mercaptopurine (PURINETHOL) 50 MG tablet Take 1.5 tablets by mouth daily.     . metFORMIN (GLUCOPHAGE) 500 MG tablet Take 250-500 mg by mouth 2 (two) times daily with a meal. Take 500mg  in the morning and 250mg  in the evening    . metoprolol tartrate (LOPRESSOR) 25 MG tablet Take 25 mg by mouth 2 (two) times daily.     . nitroGLYCERIN (NITROSTAT) 0.4 MG SL tablet Place 1 tablet (0.4 mg total) under the tongue every 5 (five) minutes as needed for chest pain (x 3 doses). 100 tablet 1  . Omega-3 1000  MG CAPS Take 1 capsule by mouth 2 (two) times daily.     . rosuvastatin (CRESTOR) 20 MG tablet Take 20 mg by mouth daily.    . TRICOR 145 MG tablet Take 1 tablet by mouth daily.    . valsartan-hydrochlorothiazide (DIOVAN-HCT) 160-12.5 MG per tablet Take 1 tablet by mouth daily.     No facility-administered medications prior to visit.      Allergies:   Lipitor [atorvastatin] and Simvastatin   Social History   Social History  . Marital status: Married    Spouse name: sharon  . Number of children: 2  . Years of education: college   Occupational History  . [place] History Main Topics  . Smoking status: Former Smoker    Quit date: 03/01/1984  . Smokeless tobacco:  Never Used  . Alcohol use No  . Drug use: No  . Sexual activity: Yes   Other Topics Concern  . Not on file   Social History Narrative   Right handed male     Family History:  The patient's family history includes Cancer in his maternal grandmother, mother, and paternal aunt; Diabetes in his sister.   ROS:   Please see the history of present illness.    Fatigue but otherwise no complaints  All other systems reviewed and are negative.   PHYSICAL EXAM:   VS:  BP (!) 154/74   Pulse (!) 56   Ht 5\' 11"  (1.803 m)   Wt 245 lb (111.1 kg)   BMI 34.17 kg/m    GEN: Well nourished, well developed, in no acute distress  . Moderate obesity HEENT: normal  Neck: no JVD, carotid bruits, or masses Cardiac: RRR; no murmurs, rubs, or gallops,no edema  Respiratory:  clear to auscultation bilaterally, normal work of breathing GI: soft, nontender, nondistended, + BS MS: no deformity or atrophy  Skin: warm and dry, no rash Neuro:  Alert and Oriented x 3, Strength and sensation are intact Psych: euthymic mood, full affect  Wt Readings from Last 3 Encounters:  05/11/16 245 lb (111.1 kg)  07/08/15 231 lb 6.4 oz (105 kg)  05/10/15 239 lb (108.4 kg)      Studies/Labs Reviewed:   EKG:  EKG  Sinus bradycardia, otherwise normal tracing.  Recent Labs: No results found for requested labs within last 8760 hours.   Lipid Panel    Component Value Date/Time   CHOL 98 04/26/2015 0222   CHOL 147 03/01/2013 1449   TRIG 222 (H) 04/26/2015 0222   HDL 30 (L) 04/26/2015 0222   HDL 40 03/01/2013 1449   CHOLHDL 3.3 04/26/2015 0222   VLDL 44 (H) 04/26/2015 0222   LDLCALC 24 04/26/2015 0222   LDLCALC 59 03/01/2013 1449    Additional studies/ records that were reviewed today include:  Coronary angiography/PCI 04/26/15: 1. Single vessel obstructive CAD with late restenosis of the ramus intermediate branch. 2. Normal LV function 3. Successful stenting of the ramus intermediate with a  DES.  Diagnostic Diagram       Post-Intervention Diagram          ASSESSMENT:    1. Coronary artery disease involving native coronary artery of native heart with angina pectoris ([place])   2. Essential hypertension   3. Other hyperlipidemia   4. Obstructive sleep apnea syndrome      PLAN:  In order of problems listed above:  1. Asymptomatic. Greater than one year since restenting of the ramus intermedius without recurrent  symptoms. 2. Blood pressure is elevated. Increase valsartan HCTZ to 320/12.5 mg. Check kidney and potassium levels in one month. 3. Lipid panel will be done at the time kidney function is evaluated in one month. 4. Encouraged compliance.  Overall plan aerobic activity, decreased caloric intake, better blood pressure control. Check see med and lipid panel in 1 month. Clinical follow-up in 6-9 months.    Medication Adjustments/Labs and Tests Ordered: Current medicines are reviewed at length with the patient today.  Concerns regarding medicines are outlined above.  Medication changes, Labs and Tests ordered today are listed in the Patient Instructions below. There are no Patient Instructions on file for this visit.   Signed, Sinclair Grooms, MD  05/11/2016 8:38 AM    [facility] [place], [place], [place]  [address] Phone: [phone]; Fax: [phone]

## 2016-05-11 ENCOUNTER — Ambulatory Visit (INDEPENDENT_AMBULATORY_CARE_PROVIDER_SITE_OTHER): Payer: 59 | Admitting: Interventional Cardiology

## 2016-05-11 ENCOUNTER — Encounter: Payer: Self-pay | Admitting: Interventional Cardiology

## 2016-05-11 VITALS — BP 154/74 | HR 56 | Ht 71.0 in | Wt 245.0 lb

## 2016-05-11 DIAGNOSIS — G4733 Obstructive sleep apnea (adult) (pediatric): Secondary | ICD-10-CM | POA: Diagnosis not present

## 2016-05-11 DIAGNOSIS — E784 Other hyperlipidemia: Secondary | ICD-10-CM | POA: Diagnosis not present

## 2016-05-11 DIAGNOSIS — I25119 Atherosclerotic heart disease of native coronary artery with unspecified angina pectoris: Secondary | ICD-10-CM | POA: Diagnosis not present

## 2016-05-11 DIAGNOSIS — I10 Essential (primary) hypertension: Secondary | ICD-10-CM

## 2016-05-11 DIAGNOSIS — E7849 Other hyperlipidemia: Secondary | ICD-10-CM

## 2016-05-11 MED ORDER — VALSARTAN 160 MG PO TABS: ORAL_TABLET | ORAL | 0 refills | Status: DC

## 2016-05-11 NOTE — Patient Instructions (Signed)
Medication Instructions:  1) INCREASE your Valsartan/HCT to 320/12.5mg  once daily. 2) We have sent in a prescription for Valsartan 160mg  once daily.  Take this tablet in addition to your Valsartan 160/12.5mg  until you run out of the lower dose.  When you are almost out of the lower dose, please contact our office so we can send in the prescription for the Valsartan/HCT 320/12.5mg .   Labwork: Your physician recommends that you return for lab work in: 1 month (CMET,Lipids)   Testing/Procedures: None  Follow-Up: Your physician wants you to follow-up in: 6-9 months with Dr. Tamala Julian.  You will receive a reminder letter in the mail two months in advance. If you don't receive a letter, please call our office to schedule the follow-up appointment.   Any Other Special Instructions Will Be Listed Below (If Applicable).     If you need a refill on your cardiac medications before your next appointment, please call your pharmacy.

## 2016-06-10 ENCOUNTER — Other Ambulatory Visit: Payer: 59 | Admitting: *Deleted

## 2016-06-10 ENCOUNTER — Other Ambulatory Visit: Payer: Self-pay | Admitting: Interventional Cardiology

## 2016-06-10 DIAGNOSIS — I25119 Atherosclerotic heart disease of native coronary artery with unspecified angina pectoris: Secondary | ICD-10-CM

## 2016-06-10 DIAGNOSIS — E1100 Type 2 diabetes mellitus with hyperosmolarity without nonketotic hyperglycemic-hyperosmolar coma (NKHHC): Secondary | ICD-10-CM

## 2016-06-10 DIAGNOSIS — E7800 Pure hypercholesterolemia, unspecified: Secondary | ICD-10-CM

## 2016-06-10 DIAGNOSIS — G450 Vertebro-basilar artery syndrome: Secondary | ICD-10-CM

## 2016-06-10 DIAGNOSIS — I10 Essential (primary) hypertension: Secondary | ICD-10-CM

## 2016-06-10 LAB — COMPREHENSIVE METABOLIC PANEL
ALT: 38 IU/L (ref 0–44)
AST: 38 IU/L (ref 0–40)
Albumin/Globulin Ratio: 1.4 (ref 1.2–2.2)
Albumin: 4.3 g/dL (ref 3.6–4.8)
Alkaline Phosphatase: 51 IU/L (ref 39–117)
BUN/Creatinine Ratio: 22 (ref 10–24)
BUN: 22 mg/dL (ref 8–27)
Bilirubin Total: 0.3 mg/dL (ref 0.0–1.2)
CO2: 20 mmol/L (ref 18–29)
Calcium: 9.2 mg/dL (ref 8.6–10.2)
Chloride: 101 mmol/L (ref 96–106)
Creatinine, Ser: 1.02 mg/dL (ref 0.76–1.27)
GFR calc Af Amer: 90 mL/min/{1.73_m2} (ref >59)
GFR calc non Af Amer: 78 mL/min/{1.73_m2} (ref >59)
Globulin, Total: 3 g/dL (ref 1.5–4.5)
Glucose: 134 mg/dL — ABNORMAL HIGH (ref 65–99)
Potassium: 4.6 mmol/L (ref 3.5–5.2)
Sodium: 140 mmol/L (ref 134–144)
Total Protein: 7.3 g/dL (ref 6.0–8.5)

## 2016-06-10 LAB — LIPID PANEL
Chol/HDL Ratio: 3.1 ratio (ref 0.0–5.0)
Cholesterol, Total: 103 mg/dL (ref 100–199)
HDL: 33 mg/dL — ABNORMAL LOW (ref >39)
LDL Calculated: 26 mg/dL (ref 0–99)
Triglycerides: 222 mg/dL — ABNORMAL HIGH (ref 0–149)
VLDL Cholesterol Cal: 44 mg/dL — ABNORMAL HIGH (ref 5–40)

## 2016-06-10 NOTE — Addendum Note (Signed)
Addended by: Eulis Foster on: 06/10/2016 07:31 AM   Modules accepted: Orders

## 2016-06-10 NOTE — Progress Notes (Signed)
p 

## 2016-09-17 ENCOUNTER — Other Ambulatory Visit: Payer: Self-pay | Admitting: Internal Medicine

## 2016-09-17 DIAGNOSIS — R109 Unspecified abdominal pain: Secondary | ICD-10-CM

## 2016-09-22 ENCOUNTER — Ambulatory Visit
Admission: RE | Admit: 2016-09-22 | Discharge: 2016-09-22 | Disposition: A | Discharge status: Home / Self Care | Payer: 59 | Source: Ambulatory Visit | Attending: Internal Medicine | Admitting: Internal Medicine

## 2016-09-22 DIAGNOSIS — R109 Unspecified abdominal pain: Secondary | ICD-10-CM

## 2016-10-13 ENCOUNTER — Telehealth: Payer: Self-pay | Admitting: Interventional Cardiology

## 2016-10-13 ENCOUNTER — Other Ambulatory Visit: Payer: Self-pay | Admitting: Urology

## 2016-10-13 NOTE — Telephone Encounter (Signed)
New Message     [facility] Pre-operative Risk Assessment    Request for surgical clearance:  1. What type of surgery is being performed?   When is this surgery scheduled? 9/25   2. Are there any medications that need to be held prior to surgery and how long? plavix 75 5 days prior   3. Name of physician performing surgery? Dr. Junious Silk  4. What is your office phone and fax number? [phone] ext [phone] fax [phone]   Tamera Reason 10/13/2016, 3:06 PM  _________________________________________________________________   (provider comments below)

## 2016-10-13 NOTE — Telephone Encounter (Signed)
Procedure: Ureteroscopy holmium laser lithotripsy stent placement

## 2016-10-13 NOTE — Telephone Encounter (Signed)
Cleared to proceed with a urological procedure. It is reasonable and safe to hold Plavix 5 days prior to this procedure. Resume when safe following the procedure.

## 2016-10-15 NOTE — Telephone Encounter (Signed)
Faxed to requesting office. 

## 2016-10-26 ENCOUNTER — Encounter (HOSPITAL_COMMUNITY): Admission: RE | Admit: 2016-10-26 | Payer: 59 | Source: Ambulatory Visit

## 2016-10-27 ENCOUNTER — Encounter (HOSPITAL_COMMUNITY): Admission: RE | Payer: Self-pay | Source: Ambulatory Visit

## 2016-10-27 ENCOUNTER — Ambulatory Visit (HOSPITAL_COMMUNITY): Admission: RE | Admit: 2016-10-27 | Payer: 59 | Source: Ambulatory Visit | Admitting: Urology

## 2016-10-27 ENCOUNTER — Telehealth: Payer: Self-pay | Admitting: Interventional Cardiology

## 2016-10-27 SURGERY — URETEROSCOPY, WITH LITHOTRIPSY USING HOLMIUM LASER
Anesthesia: General | Laterality: Left

## 2016-10-27 NOTE — Telephone Encounter (Signed)
Walk in pt Form-sealed Envelope Dropped off placed in [employer].

## 2016-12-02 NOTE — Progress Notes (Signed)
Cardiology Office Note    Date:  12/03/2016   ID:  Melburn, Treiber Oct 08, 1955, MRN [id number]  PCP:  Josetta Huddle, MD  Cardiologist: Sinclair Grooms, MD   Chief Complaint  Patient presents with  . Coronary Artery Disease    History of Present Illness:  Jesus Stone is a 61 y.o. male presents for follow-up of coronary artery disease with bare-metal stent 2000 and DES 2017 for ISR of ramus, hyperlipidemia, hypertension, obstructive sleep apnea,and type 2 diabetes.  He is now 18 months post restenting of the ramus intermedius because of late in-stent restenosis.  He has had no recent cardiac related issues.  No hospital stay or emergency department visits since March 2017 prior to restenting.  Currently taking aspirin and Plavix.  He has a stent inside a stent in the ramus intermedius.  He has not had recurrent angina.    He is not on ARB/HCTZ combination because of mixed.  He never called back to let us know when his prior prescription expired.  We will re-prescribe Diovan HCTZ 320/12.5 mg.  Overall he feels well.    Past Medical History:  Diagnosis Date  . Acute sinusitis, unspecified   . Anal polyp   . Bell's palsy   . CAD (coronary artery disease)    a. BMSx2 to ramus 2000  b. [place] s/p DES to ramus for instent restenosis 04/26/15  . Crohn's disease ([place])   . Diabetes mellitus, type 2 ([place])   . Gout, unspecified   . HLD (hyperlipidemia)   . HTN (hypertension)   . Nontoxic multinodular goiter   . Obesity   . Periodic limb movement disorder   . Sleep apnea   . Thyroid nodule   . TIA (transient ischemic attack)   . Unspecified vitamin D deficiency     Past Surgical History:  Procedure Laterality Date  . APPENDECTOMY  1964  . CARDIAC CATHETERIZATION N/A 04/26/2015   Procedure: Left Heart Cath and Coronary Angiography;  Surgeon: Peter M Martinique, MD;  Location: [place] [facility];  Service: Cardiovascular;  Laterality: N/A;  . CARDIAC CATHETERIZATION N/A  04/26/2015   Procedure: Intravascular Pressure Wire/FFR Study;  Surgeon: Peter M Martinique, MD;  Location: [place] [facility];  Service: Cardiovascular;  Laterality: N/A;  . CARDIAC CATHETERIZATION N/A 04/26/2015   Procedure: Coronary Stent Intervention;  Surgeon: Peter M Martinique, MD;  Location: [place] [facility];  Service: Cardiovascular;  Laterality: N/A;  . Coronary artery stent placment  03/1998  . HEEL SPUR EXCISION Right 2008  . RECTAL POLYPECTOMY  2008, 2009  . TESTICLE SURGERY  1967    Current Medications: Outpatient Medications Prior to Visit  Medication Sig Dispense Refill  . Alpha-D-Galactosidase (BEANO) TABS Take 2 tablets by mouth every morning.    . APRISO 0.375 g 24 hr capsule Take 1,500 mg by mouth daily.     Marland Kitchen aspirin EC 81 MG EC tablet Take 1 tablet (81 mg total) by mouth daily. 30 tablet   . Cholecalciferol (VITAMIN D3) 2000 UNITS TABS Take 1 capsule by mouth 2 (two) times daily.     . clopidogrel (PLAVIX) 75 MG tablet Take 1 tablet (75 mg total) by mouth daily. 90 tablet 3  . mercaptopurine (PURINETHOL) 50 MG tablet Take 1.5 tablets by mouth daily.     . metFORMIN (GLUCOPHAGE) 500 MG tablet Take 250-500 mg by mouth 2 (two) times daily with a meal. Take 500mg  in the morning and 250mg  in the  evening    . metoprolol tartrate (LOPRESSOR) 25 MG tablet Take 25 mg by mouth 2 (two) times daily.     . nitroGLYCERIN (NITROSTAT) 0.4 MG SL tablet Place 1 tablet (0.4 mg total) under the tongue every 5 (five) minutes as needed for chest pain (x 3 doses). 100 tablet 1  . Omega-3 1000 MG CAPS Take 1 capsule by mouth 2 (two) times daily.     . rosuvastatin (CRESTOR) 20 MG tablet Take 20 mg by mouth daily.    . TRICOR 145 MG tablet Take 1 tablet by mouth daily.    . valsartan (DIOVAN) 160 MG tablet Take one tablet by mouth daily in addition to your Valsartan/HCT 160/12.5mg . 60 tablet 0  . valsartan-hydrochlorothiazide (DIOVAN-HCT) 160-12.5 MG per tablet Take 1 tablet by mouth daily.     No  facility-administered medications prior to visit.      Allergies:   Lipitor [atorvastatin] and Simvastatin   Social History   Social History  . Marital status: Married    Spouse name: sharon  . Number of children: 2  . Years of education: college   Occupational History  . [place] History Main Topics  . Smoking status: Former Smoker    Quit date: 03/01/1984  . Smokeless tobacco: Never Used  . Alcohol use No  . Drug use: No  . Sexual activity: Yes   Other Topics Concern  . Not on file   Social History Narrative   Right handed male     Family History:  The patient's family history includes Cancer in his maternal grandmother, mother, and paternal aunt; Diabetes in his sister.   ROS:   Please see the history of present illness.    Weight gain.  Sedentary lifestyle. All other systems reviewed and are negative.   PHYSICAL EXAM:   VS:  There were no vitals taken for this visit.   GEN: Well nourished, well developed, in no acute distress.  Obese. HEENT: normal  Neck: no JVD, carotid bruits, or masses Cardiac: RRR; no murmurs, rubs, or gallops,no edema  Respiratory:  clear to auscultation bilaterally, normal work of breathing GI: soft, nontender, nondistended, + BS MS: no deformity or atrophy  Skin: warm and dry, no rash Neuro:  Alert and Oriented x 3, Strength and sensation are intact Psych: euthymic mood, full affect  Wt Readings from Last 3 Encounters:  05/11/16 245 lb (111.1 kg)  07/08/15 231 lb 6.4 oz (105 kg)  05/10/15 239 lb (108.4 kg)      Studies/Labs Reviewed:   EKG:  EKG EKG is not repeated on today's visit.  Recent Labs: 06/10/2016: ALT 38; BUN 22; Creatinine, Ser 1.02; Potassium 4.6; Sodium 140   Lipid Panel    Component Value Date/Time   CHOL 103 06/10/2016 0731   TRIG 222 (H) 06/10/2016 0731   HDL 33 (L) 06/10/2016 0731   CHOLHDL 3.1 06/10/2016 0731   CHOLHDL 3.3 04/26/2015 0222   VLDL 44 (H) 04/26/2015 0222    LDLCALC 26 06/10/2016 0731    Additional studies/ records that were reviewed today include:  Cardiac cath and PCI March 2017: Coronary Diagrams   Diagnostic Diagram       Post-Intervention Diagram           ASSESSMENT:    1. Coronary artery disease involving native coronary artery of native heart with angina pectoris ([place])   2. Essential hypertension   3. Obstructive sleep apnea syndrome   4. Type  2 diabetes mellitus with hyperosmolarity without coma, without long-term current use of insulin ([place])   5. Pure hypercholesterolemia      PLAN:  In order of problems listed above:  1. Continue aggressive risk factor modification.  Continue dual antiplatelet therapy since he has stent inside a stent.  Encouraged aerobic exercise.  LDL less than 70.  Hemoglobin A1c less than 7.  Blood pressure 130/85 mmHg or less.  Compliance with CPAP.  Notify us if angina.  Clinical follow-up in 1 year. 2. Low-salt diet discussed.  A new prescription for Diovan HCTZ is sent and a basic metabolic panel will be done 1-2 weeks after resumption of therapy. 3. Compliance with CPAP is encouraged. 4. Plant-based diet with carbohydrate elimination is much as possible. 5. Low-fat.  Needs a lipid and liver panel in May 2019.  Aerobic activity.  Clinical follow-up one year.    Medication Adjustments/Labs and Tests Ordered: Current medicines are reviewed at length with the patient today.  Concerns regarding medicines are outlined above.  Medication changes, Labs and Tests ordered today are listed in the Patient Instructions below. There are no Patient Instructions on file for this visit.   Signed, Sinclair Grooms, MD  12/03/2016 9:39 AM    [facility] [address], [place], [place]  [address] Phone: [phone]; Fax: [phone]

## 2016-12-03 ENCOUNTER — Ambulatory Visit (INDEPENDENT_AMBULATORY_CARE_PROVIDER_SITE_OTHER): Payer: 59 | Admitting: Interventional Cardiology

## 2016-12-03 ENCOUNTER — Encounter: Payer: Self-pay | Admitting: Interventional Cardiology

## 2016-12-03 VITALS — BP 136/78 | HR 62 | Ht 71.0 in | Wt 246.1 lb

## 2016-12-03 DIAGNOSIS — I25119 Atherosclerotic heart disease of native coronary artery with unspecified angina pectoris: Secondary | ICD-10-CM

## 2016-12-03 DIAGNOSIS — I10 Essential (primary) hypertension: Secondary | ICD-10-CM

## 2016-12-03 DIAGNOSIS — E7800 Pure hypercholesterolemia, unspecified: Secondary | ICD-10-CM

## 2016-12-03 DIAGNOSIS — G4733 Obstructive sleep apnea (adult) (pediatric): Secondary | ICD-10-CM

## 2016-12-03 DIAGNOSIS — E1100 Type 2 diabetes mellitus with hyperosmolarity without nonketotic hyperglycemic-hyperosmolar coma (NKHHC): Secondary | ICD-10-CM | POA: Diagnosis not present

## 2016-12-03 MED ORDER — VALSARTAN-HYDROCHLOROTHIAZIDE 320-12.5 MG PO TABS: 1.0000 | ORAL_TABLET | Freq: Every day | ORAL | 3 refills | Status: DC

## 2016-12-03 MED ORDER — ROSUVASTATIN CALCIUM 20 MG PO TABS: 20.0000 mg | ORAL_TABLET | Freq: Every day | ORAL | 3 refills | Status: DC

## 2016-12-03 NOTE — Patient Instructions (Signed)
Medication Instructions:  1) START Diovan/HCT 320/12.5mg  once daily  Labwork: Your physician recommends that you return for lab work in: 2 weeks (BMET)   Testing/Procedures: None  Follow-Up: Your physician wants you to follow-up in: 1 year with Dr. Tamala Julian.  You will receive a reminder letter in the mail two months in advance. If you don't receive a letter, please call our office to schedule the follow-up appointment.   Any Other Special Instructions Will Be Listed Below (If Applicable).     If you need a refill on your cardiac medications before your next appointment, please call your pharmacy.

## 2016-12-17 ENCOUNTER — Other Ambulatory Visit: Payer: 59 | Admitting: *Deleted

## 2016-12-17 DIAGNOSIS — I10 Essential (primary) hypertension: Secondary | ICD-10-CM

## 2016-12-17 DIAGNOSIS — I25119 Atherosclerotic heart disease of native coronary artery with unspecified angina pectoris: Secondary | ICD-10-CM

## 2016-12-17 LAB — BASIC METABOLIC PANEL
BUN/Creatinine Ratio: 19 (ref 10–24)
BUN: 19 mg/dL (ref 8–27)
CO2: 23 mmol/L (ref 20–29)
Calcium: 9.4 mg/dL (ref 8.6–10.2)
Chloride: 99 mmol/L (ref 96–106)
Creatinine, Ser: 1.02 mg/dL (ref 0.76–1.27)
GFR calc Af Amer: 90 mL/min/{1.73_m2} (ref >59)
GFR calc non Af Amer: 78 mL/min/{1.73_m2} (ref >59)
Glucose: 160 mg/dL — ABNORMAL HIGH (ref 65–99)
Potassium: 4.5 mmol/L (ref 3.5–5.2)
Sodium: 138 mmol/L (ref 134–144)

## 2017-06-03 ENCOUNTER — Other Ambulatory Visit: Payer: Self-pay | Admitting: Interventional Cardiology

## 2017-06-03 MED ORDER — CLOPIDOGREL BISULFATE 75 MG PO TABS: 75.0000 mg | ORAL_TABLET | Freq: Every day | ORAL | 1 refills | Status: DC

## 2017-11-19 ENCOUNTER — Other Ambulatory Visit: Payer: Self-pay | Admitting: Interventional Cardiology

## 2017-11-28 ENCOUNTER — Other Ambulatory Visit: Payer: Self-pay | Admitting: Interventional Cardiology

## 2017-12-05 NOTE — Progress Notes (Signed)
Cardiology Office Note:    Date:  12/06/2017   ID:  Jesus Stone, DOB [birth year], MRN [id number]  PCP:  Josetta Huddle, MD  Cardiologist:  Sinclair Grooms, MD   Referring MD: Josetta Huddle, MD   Chief Complaint  Patient presents with  . Coronary Artery Disease    History of Present Illness:    Jesus Stone is a 62 y.o. male with a hx of coronary artery disease with bare-metal stent 2000 and DES 2017 for ISR of ramus, hyperlipidemia, hypertension, obstructive sleep apnea, and type 2 diabetes.  Overall, is doing well.  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  He feels relieved.  He had chronic sensations of chest tightness usually when at work and under stress.  Since retirement he has begun walking 3 miles at least 3 times per week.  No chest discomfort or complaints associated with the activity.  He is not following a prudent diet.  He is trying to lose weight with exercise and calorie control.  He feels he sleeps okay and denies snoring based upon observations from his wife.  He is concerned about his longevity.  Only other complaint is occasionally has cramps in his calves and feet at night when he sleeps.  Past Medical History:  Diagnosis Date  . Acute sinusitis, unspecified   . Anal polyp   . Bell's palsy   . CAD (coronary artery disease)    a. BMSx2 to ramus 2000  b. [place] s/p DES to ramus for instent restenosis 04/26/15  . Crohn's disease ([place])   . Diabetes mellitus, type 2 ([place])   . Gout, unspecified   . HLD (hyperlipidemia)   . HTN (hypertension)   . Nontoxic multinodular goiter   . Obesity   . Periodic limb movement disorder   . Sleep apnea   . Thyroid nodule   . TIA (transient ischemic attack)   . Unspecified vitamin D deficiency     Past Surgical History:  Procedure Laterality Date  . APPENDECTOMY  1964  . CARDIAC CATHETERIZATION N/A 04/26/2015   Procedure: Left  Heart Cath and Coronary Angiography;  Surgeon: Peter M Martinique, MD;  Location: [place] [facility];  Service: Cardiovascular;  Laterality: N/A;  . CARDIAC CATHETERIZATION N/A 04/26/2015   Procedure: Intravascular Pressure Wire/FFR Study;  Surgeon: Peter M Martinique, MD;  Location: [place] [facility];  Service: Cardiovascular;  Laterality: N/A;  . CARDIAC CATHETERIZATION N/A 04/26/2015   Procedure: Coronary Stent Intervention;  Surgeon: Peter M Martinique, MD;  Location: [place] [facility];  Service: Cardiovascular;  Laterality: N/A;  . Coronary artery stent placment  03/1998  . HEEL SPUR EXCISION Right 2008  . RECTAL POLYPECTOMY  2008, 2009  . TESTICLE SURGERY  1967    Current Medications: Current Meds  Medication Sig  . Alpha-D-Galactosidase (BEANO) TABS Take 2 tablets by mouth every morning.  . APRISO 0.375 g 24 hr capsule Take 1,500 mg by mouth daily.   Marland Kitchen aspirin EC 81 MG EC tablet Take 1 tablet (81 mg total) by mouth daily.  . Cholecalciferol (VITAMIN D3) 2000 UNITS TABS Take 1 capsule by mouth 2 (two) times daily.   . mercaptopurine (PURINETHOL) 50 MG tablet Take 1.5 tablets by mouth daily.   . metFORMIN (GLUCOPHAGE) 500 MG tablet Take 250-500 mg by mouth 2 (two) times daily with a meal. Take 500mg  in the  morning and 250mg  in the evening  . metoprolol tartrate (LOPRESSOR) 25 MG tablet Take 25 mg by mouth 2 (two) times daily.   . nitroGLYCERIN (NITROSTAT) 0.4 MG SL tablet Place 1 tablet (0.4 mg total) under the tongue every 5 (five) minutes as needed for chest pain (x 3 doses).  . Omega-3 1000 MG CAPS Take 1 capsule by mouth 2 (two) times daily.   . rosuvastatin (CRESTOR) 20 MG tablet Take 1 tablet (20 mg total) by mouth daily. Please keep upcoming appt in November for future refills. Thank you.  . TRICOR 145 MG tablet Take 1 tablet by mouth daily.  . valsartan-hydrochlorothiazide (DIOVAN-HCT) 320-12.5 MG tablet Take 1 tablet by mouth daily. Please keep upcoming appt in November for future refills.  Thank you.  . [DISCONTINUED] clopidogrel (PLAVIX) 75 MG tablet Take 1 tablet (75 mg total) by mouth daily. Please keep upcoming appt in November with Dr. Tamala Julian for future refills. Thank you     Allergies:   Lipitor [atorvastatin] and Simvastatin   Social History   Socioeconomic History  . Marital status: Married    Spouse name: sharon  . Number of children: 2  . Years of education: college  . Highest education level: Not on file  Occupational History  . Occupation: [employer]    Employer: [employer]  . Financial resource strain: Not on file  . Food insecurity:    Worry: Not on file    Inability: Not on file  . Transportation needs:    Medical: Not on file    Non-medical: Not on file  Tobacco Use  . Smoking status: Former Smoker    Last attempt to quit: 03/01/1984    Years since quitting: 33.7  . Smokeless tobacco: Never Used  Substance and Sexual Activity  . Alcohol use: No    Alcohol/week: 0.0 standard drinks  . Drug use: No  . Sexual activity: Yes  Lifestyle  . Physical activity:    Days per week: Not on file    Minutes per session: Not on file  . Stress: Not on file  Relationships  . Social connections:    Talks on phone: Not on file    Gets together: Not on file    Attends religious service: Not on file    Active member of club or organization: Not on file    Attends meetings of clubs or organizations: Not on file    Relationship status: Not on file  Other Topics Concern  . Not on file  Social History Narrative   Right handed male     Family History: The patient's family history includes Cancer in his maternal grandmother, mother, and paternal aunt; Diabetes in his sister.  ROS:   Please see the history of present illness.    Leg cramping, intermittent headaches, stress and worry.  All other systems reviewed and are negative.  EKGs/Labs/Other Studies Reviewed:    The following studies were reviewed today: React catheterization with PCI  March 2017: Diagnostic Diagram       Post-Intervention Diagram           EKG:  EKG is  ordered today.  The ekg ordered today demonstrates sinus bradycardia at 50 bpm.  Otherwise unremarkable.  Recent Labs: 12/17/2016: BUN 19; Creatinine, Ser 1.02; Potassium 4.5; Sodium 138  Recent Lipid Panel    Component Value Date/Time   CHOL 103 06/10/2016 0731   TRIG 222 (H) 06/10/2016 0731   HDL 33 (L) 06/10/2016  0731   CHOLHDL 3.1 06/10/2016 0731   CHOLHDL 3.3 04/26/2015 0222   VLDL 44 (H) 04/26/2015 0222   LDLCALC 26 06/10/2016 0731    Physical Exam:    VS:  BP 116/72   Pulse (!) 50   Ht 5\' 11"  (1.803 m)   Wt 237 lb (107.5 kg)   SpO2 98%   BMI 33.05 kg/m     Wt Readings from Last 3 Encounters:  12/06/17 237 lb (107.5 kg)  12/03/16 246 lb 1.9 oz (111.6 kg)  05/11/16 245 lb (111.1 kg)     GEN: Moderate obesity.  Well nourished, well developed in no acute distress HEENT: Normal NECK: No JVD. LYMPHATICS: No lymphadenopathy CARDIAC: Slow RRR, no murmur, no gallop, no edema. VASCULAR: 2+ bilateral radial pulses.  No bruits. RESPIRATORY:  Clear to auscultation without rales, wheezing or rhonchi  ABDOMEN: Soft, non-tender, non-distended, No pulsatile mass, MUSCULOSKELETAL: No deformity  SKIN: Warm and dry NEUROLOGIC:  Alert and oriented x 3 PSYCHIATRIC:  Normal affect   ASSESSMENT:    1. Essential hypertension   2. Coronary artery disease involving native coronary artery of native heart with angina pectoris ([place])   3. Sleep apnea, unspecified type   4. Mixed hyperlipidemia   5. Type 2 diabetes mellitus with hyperosmolarity without coma, without long-term current use of insulin ([place])   6. Slow heart rate    PLAN:    In order of problems listed above:  1. Excellent blood pressure less than target of 130/80 mmHg.  Stone-salt diet reiterated. 2. Stable coronary disease.  History of ramus intermedius in-stent restenosis 2017.  Has been on dual antiplatelet therapy since  that time.  No anginal complaints.  Discontinue Plavix.  Decrease aspirin to 81 mg/day because of bleeding risk. 3. Continue CPAP. 4. LDL target is less than 70.  Most recent one year ago was 63.  Lab data will be obtained by Dr. Inda Merlin later this year. 5. Discussed hemoglobin A1c target less than 7.  Aerobic activity is very important.  Discussed metric of 150 minutes or greater per week. 6. Sitter weaning or discontinuing beta-blocker therapy.  He is asymptomatic now and therefore no changes are being made.  Changes today or to decrease aspirin to 81 mg/day.  Discontinue Plavix.  Monitor heart rate over time and consider weaning/deseeding beta-blocker therapy on next visit 1 year from now.  Overall education and awareness concerning secondary risk prevention was discussed: LDL less than 70, hemoglobin A1c less than 7, blood pressure target less than 130/80 mmHg, 150 minutes of moderate aerobic activity per week, avoidance of smoking, and continued surveillance/management of obstructive sleep apnea.  Greater than 50% of the time during this office visit was spent in education, counseling, and coordination of care related to underlying disease process and testing as outlined.    Medication Adjustments/Labs and Tests Ordered: Current medicines are reviewed at length with the patient today.  Concerns regarding medicines are outlined above.  Orders Placed This Encounter  Procedures  . EKG 12-Lead   No orders of the defined types were placed in this encounter.   Patient Instructions  Medication Instructions:  Your physician has recommended you make the following change in your medication:   STOP: clopidogrel (plavix)   If you need a refill on your cardiac medications before your next appointment, please call your pharmacy.   Lab work: None ordered If you have labs (blood work) drawn today and your tests are completely normal, you will receive your results  only by: Marland Kitchen MyChart Message (if  you have MyChart) OR . A paper copy in the mail If you have any lab test that is abnormal or we need to change your treatment, we will call you to review the results.  Testing/Procedures: None ordered  Follow-Up: At [facility], you and your health needs are our priority.  As part of our continuing mission to provide you with exceptional heart care, we have created designated Provider Care Teams.  These Care Teams include your primary Cardiologist (physician) and Advanced Practice Providers (APPs -  Physician Assistants and Nurse Practitioners) who all work together to provide you with the care you need, when you need it. . You will need a follow up appointment in 1 year.  Please call our office 2 months in advance to schedule this appointment.  You may see Sinclair Grooms, MD or one of the following Advanced Practice Providers on your designated Care Team:   . Truitt Merle, NP . Cecilie Kicks, NP . Kathyrn Drown, NP   Any Other Special Instructions Will Be Listed Below (If Applicable).     Signed, Sinclair Grooms, MD  12/06/2017 8:42 AM    [place]

## 2017-12-06 ENCOUNTER — Ambulatory Visit (INDEPENDENT_AMBULATORY_CARE_PROVIDER_SITE_OTHER): Payer: 59 | Admitting: Interventional Cardiology

## 2017-12-06 ENCOUNTER — Encounter: Payer: Self-pay | Admitting: Interventional Cardiology

## 2017-12-06 VITALS — BP 116/72 | HR 50 | Ht 71.0 in | Wt 237.0 lb

## 2017-12-06 DIAGNOSIS — E1100 Type 2 diabetes mellitus with hyperosmolarity without nonketotic hyperglycemic-hyperosmolar coma (NKHHC): Secondary | ICD-10-CM

## 2017-12-06 DIAGNOSIS — E782 Mixed hyperlipidemia: Secondary | ICD-10-CM | POA: Diagnosis not present

## 2017-12-06 DIAGNOSIS — G4730 Sleep apnea, unspecified: Secondary | ICD-10-CM

## 2017-12-06 DIAGNOSIS — I25119 Atherosclerotic heart disease of native coronary artery with unspecified angina pectoris: Secondary | ICD-10-CM

## 2017-12-06 DIAGNOSIS — I10 Essential (primary) hypertension: Secondary | ICD-10-CM

## 2017-12-06 DIAGNOSIS — R001 Bradycardia, unspecified: Secondary | ICD-10-CM

## 2017-12-06 NOTE — Patient Instructions (Signed)
Medication Instructions:  Your physician has recommended you make the following change in your medication:   STOP: clopidogrel (plavix)   If you need a refill on your cardiac medications before your next appointment, please call your pharmacy.   Lab work: None ordered If you have labs (blood work) drawn today and your tests are completely normal, you will receive your results only by: Marland Kitchen MyChart Message (if you have MyChart) OR . A paper copy in the mail If you have any lab test that is abnormal or we need to change your treatment, we will call you to review the results.  Testing/Procedures: None ordered  Follow-Up: At [facility], you and your health needs are our priority.  As part of our continuing mission to provide you with exceptional heart care, we have created designated Provider Care Teams.  These Care Teams include your primary Cardiologist (physician) and Advanced Practice Providers (APPs -  Physician Assistants and Nurse Practitioners) who all work together to provide you with the care you need, when you need it. . You will need a follow up appointment in 1 year.  Please call our office 2 months in advance to schedule this appointment.  You may see Sinclair Grooms, MD or one of the following Advanced Practice Providers on your designated Care Team:   . Truitt Merle, NP . Cecilie Kicks, NP . Kathyrn Drown, NP   Any Other Special Instructions Will Be Listed Below (If Applicable).

## 2018-02-23 ENCOUNTER — Other Ambulatory Visit: Payer: Self-pay | Admitting: Interventional Cardiology

## 2018-04-18 ENCOUNTER — Other Ambulatory Visit: Payer: Self-pay

## 2018-04-18 MED ORDER — VALSARTAN-HYDROCHLOROTHIAZIDE 320-12.5 MG PO TABS: 1.0000 | ORAL_TABLET | Freq: Every day | ORAL | 1 refills | Status: DC

## 2018-04-18 MED ORDER — ROSUVASTATIN CALCIUM 20 MG PO TABS: 20.0000 mg | ORAL_TABLET | Freq: Every day | ORAL | 1 refills | Status: DC

## 2018-06-01 ENCOUNTER — Other Ambulatory Visit: Payer: Self-pay | Admitting: *Deleted

## 2018-06-14 DIAGNOSIS — K5080 Crohn's disease of both small and large intestine without complications: Secondary | ICD-10-CM | POA: Diagnosis not present

## 2018-06-29 ENCOUNTER — Other Ambulatory Visit: Payer: Self-pay

## 2018-06-29 NOTE — Patient Outreach (Signed)
Health Risk assessment: Late entry: 05/05/2018 Health risk assessment completed on 05/05/2018.  Patient reports he is doing well. Reports managing his DM well. Reports recent weight loss.  Reports close follow up with MD.  Discussed CBG monitoring. Patient denies any concerns with medication at this time.   PLAN: Mail welcome letter.  Will plan another outreach in 6 months from original date of outreach.  Tomasa Rand, RN, BSN, CEN [facility] [employer] [phone]

## 2018-07-21 ENCOUNTER — Other Ambulatory Visit: Payer: Self-pay

## 2018-07-21 NOTE — Patient Outreach (Signed)
Health team Advantage:  Transitioned to [facility]  PLAN: close case and send MD case closure letter.  Tomasa Rand, RN, BSN, CEN [facility] [employer] [phone]

## 2018-10-06 ENCOUNTER — Other Ambulatory Visit: Payer: Self-pay | Admitting: Interventional Cardiology

## 2018-10-26 DIAGNOSIS — L03211 Cellulitis of face: Secondary | ICD-10-CM | POA: Diagnosis not present

## 2018-11-07 ENCOUNTER — Ambulatory Visit: Payer: 59

## 2018-11-08 ENCOUNTER — Other Ambulatory Visit: Payer: Self-pay | Admitting: Dermatology

## 2018-11-08 DIAGNOSIS — C4442 Squamous cell carcinoma of skin of scalp and neck: Secondary | ICD-10-CM | POA: Diagnosis not present

## 2018-11-08 DIAGNOSIS — L821 Other seborrheic keratosis: Secondary | ICD-10-CM | POA: Diagnosis not present

## 2018-11-08 DIAGNOSIS — C44329 Squamous cell carcinoma of skin of other parts of face: Secondary | ICD-10-CM | POA: Diagnosis not present

## 2018-11-08 DIAGNOSIS — D229 Melanocytic nevi, unspecified: Secondary | ICD-10-CM | POA: Diagnosis not present

## 2018-11-08 DIAGNOSIS — L570 Actinic keratosis: Secondary | ICD-10-CM | POA: Diagnosis not present

## 2018-11-24 DIAGNOSIS — C44329 Squamous cell carcinoma of skin of other parts of face: Secondary | ICD-10-CM | POA: Diagnosis not present

## 2018-12-01 NOTE — Progress Notes (Deleted)
CARDIOLOGY OFFICE NOTE  Date:  12/01/2018    Jesus Stone Date of Birth: January 06, 1956 Medical Record [id number]  PCP:  Josetta Huddle, MD  Cardiologist:  Servando Snare & ***    No chief complaint on file.   History of Present Illness: Jesus Stone is a 63 y.o. male who presents today for a *** with a hx of coronary artery disease with bare-metal stent 2000 and DES 2017 for ISR of ramus, hyperlipidemia, hypertension, obstructive sleep apnea, and type 2 diabetes.  Overall, is doing well.  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  He feels relieved.  He had chronic sensations of chest tightness usually when at work and under stress.  Since retirement he has begun walking 3 miles at least 3 times per week.  No chest discomfort or complaints associated with the activity.  He is not following a prudent diet.  He is trying to lose weight with exercise and calorie control.  He feels he sleeps okay and denies snoring based upon observations from his wife.  He is concerned about his longevity.  Only other complaint is occasionally has cramps in his calves and feet at night when he sleeps. The patient {does/does not:200015} have symptoms concerning for COVID-19 infection (fever, chills, cough, or new shortness of breath).   Comes in today. Here with   Past Medical History:  Diagnosis Date   Acute sinusitis, unspecified    Anal polyp    Bell's palsy    CAD (coronary artery disease)    a. BMSx2 to ramus 2000  b. [place] s/p DES to ramus for instent restenosis 04/26/15   Crohn's disease ([place])    Diabetes mellitus, type 2 ([place])    Gout, unspecified    HLD (hyperlipidemia)    HTN (hypertension)    Nontoxic multinodular goiter    Obesity    Periodic limb movement disorder    Sleep apnea    Thyroid nodule    TIA (transient ischemic attack)    Unspecified vitamin D deficiency      Past Surgical History:  Procedure Laterality Date   APPENDECTOMY  1964   CARDIAC CATHETERIZATION N/A 04/26/2015   Procedure: Left Heart Cath and Coronary Angiography;  Surgeon: Peter M Martinique, MD;  Location: [facility];  Service: Cardiovascular;  Laterality: N/A;   CARDIAC CATHETERIZATION N/A 04/26/2015   Procedure: Intravascular Pressure Wire/FFR Study;  Surgeon: Peter M Martinique, MD;  Location: [facility];  Service: Cardiovascular;  Laterality: N/A;   CARDIAC CATHETERIZATION N/A 04/26/2015   Procedure: Coronary Stent Intervention;  Surgeon: Peter M Martinique, MD;  Location: [facility];  Service: Cardiovascular;  Laterality: N/A;   Coronary artery stent placment  03/1998   HEEL SPUR EXCISION Right 2008   RECTAL POLYPECTOMY  2008, 2009   [place]     Medications: No outpatient medications have been marked as taking for the 12/07/18 encounter (Appointment) with Burtis Junes, NP.     Allergies: Allergies  Allergen Reactions   Lipitor [Atorvastatin] Other (See Comments)    Memory loss   Simvastatin     Memory loss    Social History: The patient  reports that he quit smoking about 34 years ago. He has never used smokeless tobacco. He reports that he does not drink alcohol or use drugs.   Family History: The  patient's ***family history includes Cancer in his maternal grandmother, mother, and paternal aunt; Diabetes in his sister.   Review of Systems: Please see the history of present illness.   All other systems are reviewed and negative.   Physical Exam: VS:  There were no vitals taken for this visit. Marland Kitchen  BMI There is no height or weight on file to calculate BMI.  Wt Readings from Last 3 Encounters:  12/06/17 237 lb (107.5 kg)  12/03/16 246 lb 1.9 oz (111.6 kg)  05/11/16 245 lb (111.1 kg)    General: Pleasant. Well developed, well nourished and in no acute distress.   HEENT: Normal.  Neck: Supple, no JVD, carotid bruits, or  masses noted.  Cardiac: ***Regular rate and rhythm. No murmurs, rubs, or gallops. No edema.  Respiratory:  Lungs are clear to auscultation bilaterally with normal work of breathing.  GI: Soft and nontender.  MS: No deformity or atrophy. Gait and ROM intact.  Skin: Warm and dry. Color is normal.  Neuro:  Strength and sensation are intact and no gross focal deficits noted.  Psych: Alert, appropriate and with normal affect.   LABORATORY DATA:  EKG:  EKG {ACTION; IS/IS [id number] ordered today. This demonstrates ***.  Lab Results  Component Value Date   WBC 6.1 04/27/2015   HGB 12.0 (L) 04/27/2015   HCT 35.9 (L) 04/27/2015   PLT 192 04/27/2015   GLUCOSE 160 (H) 12/17/2016   CHOL 103 06/10/2016   TRIG 222 (H) 06/10/2016   HDL 33 (L) 06/10/2016   LDLCALC 26 06/10/2016   ALT 38 06/10/2016   AST 38 06/10/2016   NA 138 12/17/2016   K 4.5 12/17/2016   CL 99 12/17/2016   CREATININE 1.02 12/17/2016   BUN 19 12/17/2016   CO2 23 12/17/2016   TSH 0.802 03/01/2013   INR 1.15 04/26/2015   HGBA1C 8.6 (H) 04/26/2015     BNP (last 3 results) No results for input(s): BNP in the last 8760 hours.  ProBNP (last 3 results) No results for input(s): PROBNP in the last 8760 hours.   Other Studies Reviewed Today:   Assessment/Plan: 1. Essential hypertension   2. Coronary artery disease involving native coronary artery of native heart with angina pectoris ([place])   3. Sleep apnea, unspecified type   4. Mixed hyperlipidemia   5. Type 2 diabetes mellitus with hyperosmolarity without coma, without long-term current use of insulin ([place])   6. Slow heart rate    PLAN:    In order of problems listed above:  1. Excellent blood pressure less than target of 130/80 mmHg.  Low-salt diet reiterated. 2. Stable coronary disease.  History of ramus intermedius in-stent restenosis 2017.  Has been on dual antiplatelet therapy since that time.  No anginal complaints.  Discontinue Plavix.  Decrease  aspirin to 81 mg/day because of bleeding risk. 3. Continue CPAP. 4. LDL target is less than 70.  Most recent one year ago was 65.  Lab data will be obtained by Dr. Inda Merlin later this year. 5. Discussed hemoglobin A1c target less than 7.  Aerobic activity is very important.  Discussed metric of 150 minutes or greater per week. 6. Sitter weaning or discontinuing beta-blocker therapy.  He is asymptomatic now and therefore no changes are being made.  Changes today or to decrease aspirin to 81 mg/day.  Discontinue Plavix.  Monitor heart rate over time and consider weaning/deseeding beta-blocker therapy on next visit 1 year from now.  Overall education and awareness concerning secondary  risk prevention was discussed: LDL less than 70, hemoglobin A1c less than 7, blood pressure target less than 130/80 mmHg, 150 minutes of moderate aerobic activity per week, avoidance of smoking, and continued surveillance/management of obstructive sleep apnea.   Marland Kitchen COVID-19 Education: The signs and symptoms of COVID-19 were discussed with the patient and how to seek care for testing (follow up with PCP or arrange E-visit).  The importance of social distancing, staying at home, hand hygiene and wearing a mask when out in public were discussed today.  Current medicines are reviewed with the patient today.  The patient does not have concerns regarding medicines other than what has been noted above.  The following changes have been made:  See above.  Labs/ tests ordered today include:   No orders of the defined types were placed in this encounter.    Disposition:   FU with *** in {gen number [id number] {Days to years:10300}.   Patient is agreeable to this plan and will call if any problems develop in the interim.   SignedTruitt Merle, NP  12/01/2018 4:10 PM  [facility] [address] [place], [place]  [address] Phone: [phone] Fax: [phone]

## 2018-12-05 NOTE — Progress Notes (Signed)
Cardiology Office Note:    Date:  12/07/2018   ID:  Jesus Stone, DOB Jun 15, 1955, MRN [id number]  PCP:  Jesus Huddle, MD  Cardiologist:  Jesus Grooms, MD  Electrophysiologist:  None   Referring MD: Jesus Huddle, MD   Chief Complaint: follow-up of CAD  History of Present Illness:    Jesus Stone is a 63 y.o. male with a history of CAD s/p BMS in 2000 and DES in 2017 for in-stent restenosis of ramus, hypertension, hyperlipidemia, type 2 diabetes, obstructive sleep apnea, and obesity who is followed by Dr. Tamala Stone and presents today for follow-up of CAD.  Patient has done well from a cardiac standpoint over the last couple of years following DES to in-stent restenosis of ramus in 2017. He was last seen by Dr. Tamala Stone in 12/2017 at which time he reported being under a lot of stress due to his job but had recently retired. However, he was doing well from a cardiac standpoint. Plavix was discontinued at that visit and Aspirin was decreased to 81mg  daily. Dr. Tamala Stone also recommend monitoring heart rate over time and considering weaning/disconitnuing beta-blocker at next visit.  Most Recent Lab (from [facility]): - 12/2017: Hgb 13.4, Cr 1.080, K 4.7, Hgb A1c 7.3, Total Chol 96, Trig 206, HDL 34, LDL 20.  Patient presents today for annual follow-up. Patient is doing well today from a cardiac standpoint. He is staying active walking 30 minutes per day. He went on a 7 mile walk a couple of weeks ago and had no issues. He is watching his diet and has lost about 9 lbs since last office visit. He denies any cardiac symptoms including chest pain, shortness of breath, palpitations, lightheadedness, dizziness, orthopnea, PND, and lower extremity edema.   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   Past Medical History:  Diagnosis Date  . Acute sinusitis, unspecified   . Anal polyp   . Bell's palsy   .  CAD (coronary artery disease)    a. BMSx2 to ramus 2000  b. [place] s/p DES to ramus for instent restenosis 04/26/15  . Crohn's disease ([place])   . Diabetes mellitus, type 2 ([place])   . Gout, unspecified   . HLD (hyperlipidemia)   . HTN (hypertension)   . Nontoxic multinodular goiter   . Obesity   . Periodic limb movement disorder   . Sleep apnea   . Thyroid nodule   . TIA (transient ischemic attack)   . Unspecified vitamin D deficiency     Past Surgical History:  Procedure Laterality Date  . APPENDECTOMY  1964  . CARDIAC CATHETERIZATION N/A 04/26/2015   Procedure: Left Heart Cath and Coronary Angiography;  Surgeon: Jesus M Martinique, MD;  Location: [facility];  Service: Cardiovascular;  Laterality: N/A;  . CARDIAC CATHETERIZATION N/A 04/26/2015   Procedure: Intravascular Pressure Wire/FFR Study;  Surgeon: Jesus M Martinique, MD;  Location: [facility];  Service: Cardiovascular;  Laterality: N/A;  . CARDIAC CATHETERIZATION N/A 04/26/2015   Procedure: Coronary Stent Intervention;  Surgeon: Jesus M Martinique, MD;  Location: [facility];  Service: Cardiovascular;  Laterality: N/A;  . Coronary artery stent placment  03/1998  . HEEL SPUR EXCISION Right 2008  . RECTAL POLYPECTOMY  2008, 2009  . TESTICLE SURGERY  1967    Current Medications: Current Meds  Medication Sig  . Alpha-D-Galactosidase (BEANO) TABS  Take 2 tablets by mouth every morning.  . APRISO 0.375 g 24 hr capsule Take 1,500 mg by mouth daily.   Marland Kitchen aspirin EC 81 MG EC tablet Take 1 tablet (81 mg total) by mouth daily.  . Cholecalciferol (VITAMIN D3) 2000 UNITS TABS Take 1 capsule by mouth 2 (two) times daily.   . mercaptopurine (PURINETHOL) 50 MG tablet Take 1.5 tablets by mouth daily.   . metFORMIN (GLUCOPHAGE) 500 MG tablet Take 250-500 mg by mouth 2 (two) times daily with a meal. Take 500mg  in the morning and 250mg  in the evening  . nitroGLYCERIN (NITROSTAT) 0.4 MG SL tablet Place 1 tablet (0.4 mg total) under the  tongue every 5 (five) minutes as needed for chest pain (x 3 doses).  . Omega-3 1000 MG CAPS Take 1 capsule by mouth 2 (two) times daily.   . rosuvastatin (CRESTOR) 20 MG tablet Take 1 tablet (20 mg total) by mouth daily.  . TRICOR 145 MG tablet Take 1 tablet by mouth daily.  . valsartan-hydrochlorothiazide (DIOVAN-HCT) 320-12.5 MG tablet TAKE ONE TABLET BY MOUTH ONE TIME DAILY   . [DISCONTINUED] metoprolol tartrate (LOPRESSOR) 25 MG tablet Take 25 mg by mouth 2 (two) times daily.      Allergies:   Lipitor [atorvastatin] and Simvastatin   Social History   Socioeconomic History  . Marital status: Married    Spouse name: Jesus Stone  . Number of children: 2  . Years of education: college  . Highest education level: Not on file  Occupational History  . Occupation: [employer]    Employer: [employer]  . Financial resource strain: Not on file  . Food insecurity    Worry: Not on file    Inability: Not on file  . Transportation needs    Medical: Not on file    Non-medical: Not on file  Tobacco Use  . Smoking status: Former Smoker    Quit date: 03/01/1984    Years since quitting: 34.7  . Smokeless tobacco: Never Used  Substance and Sexual Activity  . Alcohol use: No    Alcohol/week: 0.0 standard drinks  . Drug use: No  . Sexual activity: Yes  Lifestyle  . Physical activity    Days per week: Not on file    Minutes per session: Not on file  . Stress: Not on file  Relationships  . Social Herbalist on phone: Not on file    Gets together: Not on file    Attends religious service: Not on file    Active member of club or organization: Not on file    Attends meetings of clubs or organizations: Not on file    Relationship status: Not on file  Other Topics Concern  . Not on file  Social History Narrative   Right handed male     Family History: The patient's family history includes Cancer in his maternal grandmother, mother, and paternal aunt; Diabetes in his  sister.  ROS:   Please see the history of present illness.    All other systems reviewed and are negative.  EKGs/Labs/Other Studies Reviewed:    The following studies were reviewed today:  Echocardiogram 03/23/2013: Study Conclusions: - Left ventricle: The cavity size was normal. There was mild  concentric hypertrophy. Systolic function was normal. The  estimated ejection fraction was in the range of 60% to  65%. Wall motion was normal; there were no regional wall  motion abnormalities. Doppler parameters are consistent  with abnormal left ventricular relaxation (grade 1  diastolic dysfunction). The E/e' ratio is >10, suggesting  elevated LV filing pressure.  - Left atrium: The atrium was normal in size.  - Inferior vena cava: The vessel was normal in size; the  respirophasic diameter changes were in the normal range (=  50%); findings are consistent with normal central venous  pressure.   Impressions:  - No cardiac source of emboli was indentified.  _______________  Left Heart Catheterization 04/26/2015:  Mid RCA lesion, 20% stenosed.  Prox LAD lesion, 20% stenosed.  The left ventricular systolic function is normal.  Ost Ramus to Ramus lesion, 80% stenosed. Post intervention, there is a 0% residual stenosis. The lesion was previously treated with a bare metal stent greater than two years ago.   1. Single vessel obstructive CAD with late restenosis of the ramus intermediate branch. 2. Normal LV function 3. Successful stenting of the ramus intermediate with a DES.  Plan: DAPT for one year. Anticipate DC in am.  EKG:  EKG ordered today. EKG personally reviewed and demonstrates sinus bradycardia, rate 50 bpm, with borderline 1st degree AV block but no acute ST/T changes.  Recent Labs: No results found for requested labs within last 8760 hours.  Recent Lipid Panel    Component Value Date/Time   CHOL 103 06/10/2016 0731   TRIG 222 (H) 06/10/2016  0731   HDL 33 (L) 06/10/2016 0731   CHOLHDL 3.1 06/10/2016 0731   CHOLHDL 3.3 04/26/2015 0222   VLDL 44 (H) 04/26/2015 0222   LDLCALC 26 06/10/2016 0731    Physical Exam:    Vital Signs: BP 122/80   Pulse (!) 50   Ht 5\' 11"  (1.803 m)   Wt 228 lb (103.4 kg)   SpO2 97%   BMI 31.80 kg/m     Wt Readings from Last 3 Encounters:  12/07/18 228 lb (103.4 kg)  12/06/17 237 lb (107.5 kg)  12/03/16 246 lb 1.9 oz (111.6 kg)     General: 63 y.o. Obese Caucasian male resting comfortably in no acute distress. HEENT: Normocephalic and atraumatic. Sclera clear. EOMs intact. Neck: Supple. No carotid bruits. No JVD. Heart: Bradycardic with regular rhythm. Distinct S1 and S2. No murmurs, gallops, or rubs. Radial  pulses 2+ and equal bilaterally. Lungs: No increased work of breathing. Clear to ausculation bilaterally. No wheezes, rhonchi, or rales.  Abdomen: Soft, non-distended, and non-tender to palpation. Bowel sounds present. MSK: Normal strength and tone for age. Extremities: No clubbing, cyanosis, or edema.    Skin: Warm and dry. Neuro: Alert and oriented x3. No focal deficits. Psych: Normal affect. Responds appropriately.  Assessment:    1. Coronary artery disease involving native coronary artery of native heart with angina pectoris ([place])   2. Essential hypertension   3. Hyperlipidemia, unspecified hyperlipidemia type   4. Type 2 diabetes mellitus with complication, without long-term current use of insulin ([place])   5. Sleep apnea, unspecified type   6. Class 1 obesity with body mass index (BMI) of 31.0 to 31.9 in adult, unspecified obesity type, unspecified whether serious comorbidity present     Plan:    CAD - History of DES of in-stent restenosis of ramus intermedius in 2017. - Stable. No angina. - Continue aspirin and statin.  - At last visit with Dr. Tamala Stone, he recommended considering weaning/discontinuing Lopressor 25mg  twice daily at next visit. Heart rate 50 bpm today and  patient doing well. Therefore, will go ahead and discontinue Lopressor.  Hypertension - BP  well controlled - 102/80 today. - Continue Valsartan-HCTZ 320-12.5mg  daily.  - Will recheck BMET today. - Advised patient to monitor BP over the next week after stopping Lopressor and notify us if BP consistently over 130/80.  Hyperlipidemia - Most recent lipid panel from 12/2017: Total Cholesterol 96, Triglycerides 206, HDL 34, LDL 20. - At LDL goal of <70. - Continue Crestor 20mg  daily. OK to continue Omega-3 and Fenofibrate.   - Labs followed by PCP. Scheduled to see PCP later this month. Have asked patient to have lab results faxed to [place]. Patient asked about whether he should start Vascepa. Recommended waiting to see what repeat lipid panel shows.  Type 2 Diabetes Mellitus - Hemoglobin A1c 7.3 in 12/2017. - Managed by PCP.  Sleep Apnea - Patient has sleep apnea listed in chart but patient denies this. He states he had a sleep study many years ago but is not on CPAP. He does endorse snoring but denies any obvious episodes of apnea or daytime somnolence. He has also been losing weight. - Could consider repeat sleep study in future if symptoms worsen.  Obesity - BMI 31 today.  - Patient has lost about 9 lbs since office visit last year and states he is trying to lose 25 more lbs. He is staying active walking daily and watching diet. Encouraged patient to continue these efforts.  Disposition: Follow up in 12 months with Dr. Tamala Stone.   Medication Adjustments/Labs and Tests Ordered: Current medicines are reviewed at length with the patient today.  Concerns regarding medicines are outlined above.  Orders Placed This Encounter  Procedures  . Basic Metabolic Panel (BMET)  . EKG 12-Lead   No orders of the defined types were placed in this encounter.   Patient Instructions  Medication Instructions:  Your physician has recommended you make the following change in your medication:  1. Discontinue  Lopressor  *If you need a refill on your cardiac medications before your next appointment, please call your pharmacy*  Lab Work: Your physician recommends that you have lab work today bmet.  If you have labs (blood work) drawn today and your tests are completely normal, you will receive your results only by: Marland Kitchen MyChart Message (if you have MyChart) OR . A paper copy in the mail If you have any lab test that is abnormal or we need to change your treatment, we will call you to review the results.  Testing/Procedures: -None  Follow-Up: Your physician wants you to follow-up in: 1 year with Dr.Smith.  You will receive a reminder letter in the mail two months in advance. If you don't receive a letter, please call our office to schedule the follow-up appointment.   At [facility], you and your health needs are our priority.  As part of our continuing mission to provide you with exceptional heart care, we have created designated Provider Care Teams.  These Care Teams include your primary Cardiologist (Dr. Tamala Stone ) and Advanced Practice Providers (APPs -  Physician Assistants and Nurse Practitioners) who all work together to provide you with the care you need, when you need it.  Your next appointment:   12 months  The format for your next appointment:   In Person  Provider:   Daneen Schick, MD  Other Instructions 1.  Keep check on BP X 1 week if consistently above 130/80 please call or Mychart office to let a provider know.   2.  Please have your PCP fax to office @ [phone] your upcoming labs.  Signed, Darreld Mclean, PA-C  12/07/2018 9:03 AM    [facility]

## 2018-12-07 ENCOUNTER — Other Ambulatory Visit: Payer: Self-pay

## 2018-12-07 ENCOUNTER — Ambulatory Visit: Payer: PPO | Admitting: Student

## 2018-12-07 ENCOUNTER — Encounter: Payer: Self-pay | Admitting: Student

## 2018-12-07 VITALS — BP 122/80 | HR 50 | Ht 71.0 in | Wt 228.0 lb

## 2018-12-07 DIAGNOSIS — I10 Essential (primary) hypertension: Secondary | ICD-10-CM | POA: Diagnosis not present

## 2018-12-07 DIAGNOSIS — I25119 Atherosclerotic heart disease of native coronary artery with unspecified angina pectoris: Secondary | ICD-10-CM

## 2018-12-07 DIAGNOSIS — Z6831 Body mass index (BMI) 31.0-31.9, adult: Secondary | ICD-10-CM

## 2018-12-07 DIAGNOSIS — E669 Obesity, unspecified: Secondary | ICD-10-CM | POA: Diagnosis not present

## 2018-12-07 DIAGNOSIS — E785 Hyperlipidemia, unspecified: Secondary | ICD-10-CM | POA: Diagnosis not present

## 2018-12-07 DIAGNOSIS — G4730 Sleep apnea, unspecified: Secondary | ICD-10-CM

## 2018-12-07 DIAGNOSIS — E118 Type 2 diabetes mellitus with unspecified complications: Secondary | ICD-10-CM

## 2018-12-07 LAB — BASIC METABOLIC PANEL
BUN/Creatinine Ratio: 16 (ref 10–24)
BUN: 18 mg/dL (ref 8–27)
CO2: 22 mmol/L (ref 20–29)
Calcium: 9.4 mg/dL (ref 8.6–10.2)
Chloride: 101 mmol/L (ref 96–106)
Creatinine, Ser: 1.15 mg/dL (ref 0.76–1.27)
GFR calc Af Amer: 77 mL/min/{1.73_m2} (ref >59)
GFR calc non Af Amer: 66 mL/min/{1.73_m2} (ref >59)
Glucose: 150 mg/dL — ABNORMAL HIGH (ref 65–99)
Potassium: 4.4 mmol/L (ref 3.5–5.2)
Sodium: 137 mmol/L (ref 134–144)

## 2018-12-07 NOTE — Patient Instructions (Signed)
Medication Instructions:  Your physician has recommended you make the following change in your medication:  1. Discontinue Lopressor  *If you need a refill on your cardiac medications before your next appointment, please call your pharmacy*  Lab Work: Your physician recommends that you have lab work today bmet.  If you have labs (blood work) drawn today and your tests are completely normal, you will receive your results only by: Marland Kitchen MyChart Message (if you have MyChart) OR . A paper copy in the mail If you have any lab test that is abnormal or we need to change your treatment, we will call you to review the results.  Testing/Procedures: -None  Follow-Up: Your physician wants you to follow-up in: 1 year with Dr.Smith.  You will receive a reminder letter in the mail two months in advance. If you don't receive a letter, please call our office to schedule the follow-up appointment.   At [facility], you and your health needs are our priority.  As part of our continuing mission to provide you with exceptional heart care, we have created designated Provider Care Teams.  These Care Teams include your primary Cardiologist (Dr. Tamala Julian ) and Advanced Practice Providers (APPs -  Physician Assistants and Nurse Practitioners) who all work together to provide you with the care you need, when you need it.  Your next appointment:   12 months  The format for your next appointment:   In Person  Provider:   Daneen Schick, MD  Other Instructions 1.  Keep check on BP X 1 week if consistently above 130/80 please call or Mychart office to let a provider know.   2.  Please have your PCP fax to office @ [phone] your upcoming labs.

## 2018-12-28 DIAGNOSIS — I10 Essential (primary) hypertension: Secondary | ICD-10-CM | POA: Diagnosis not present

## 2018-12-28 DIAGNOSIS — E042 Nontoxic multinodular goiter: Secondary | ICD-10-CM | POA: Diagnosis not present

## 2018-12-28 DIAGNOSIS — E538 Deficiency of other specified B group vitamins: Secondary | ICD-10-CM | POA: Diagnosis not present

## 2018-12-28 DIAGNOSIS — I2510 Atherosclerotic heart disease of native coronary artery without angina pectoris: Secondary | ICD-10-CM | POA: Diagnosis not present

## 2018-12-28 DIAGNOSIS — E782 Mixed hyperlipidemia: Secondary | ICD-10-CM | POA: Diagnosis not present

## 2018-12-28 DIAGNOSIS — Z125 Encounter for screening for malignant neoplasm of prostate: Secondary | ICD-10-CM | POA: Diagnosis not present

## 2018-12-28 DIAGNOSIS — K5080 Crohn's disease of both small and large intestine without complications: Secondary | ICD-10-CM | POA: Diagnosis not present

## 2018-12-28 DIAGNOSIS — Z0000 Encounter for general adult medical examination without abnormal findings: Secondary | ICD-10-CM | POA: Diagnosis not present

## 2018-12-28 DIAGNOSIS — E559 Vitamin D deficiency, unspecified: Secondary | ICD-10-CM | POA: Diagnosis not present

## 2018-12-28 DIAGNOSIS — M109 Gout, unspecified: Secondary | ICD-10-CM | POA: Diagnosis not present

## 2018-12-28 DIAGNOSIS — E119 Type 2 diabetes mellitus without complications: Secondary | ICD-10-CM | POA: Diagnosis not present

## 2018-12-28 DIAGNOSIS — Z832 Family history of diseases of the blood and blood-forming organs and certain disorders involving the immune mechanism: Secondary | ICD-10-CM | POA: Diagnosis not present

## 2019-01-02 DIAGNOSIS — C4442 Squamous cell carcinoma of skin of scalp and neck: Secondary | ICD-10-CM | POA: Diagnosis not present

## 2019-01-10 DIAGNOSIS — H2513 Age-related nuclear cataract, bilateral: Secondary | ICD-10-CM | POA: Diagnosis not present

## 2019-01-10 DIAGNOSIS — H25013 Cortical age-related cataract, bilateral: Secondary | ICD-10-CM | POA: Diagnosis not present

## 2019-01-10 DIAGNOSIS — H40013 Open angle with borderline findings, low risk, bilateral: Secondary | ICD-10-CM | POA: Diagnosis not present

## 2019-01-10 DIAGNOSIS — E119 Type 2 diabetes mellitus without complications: Secondary | ICD-10-CM | POA: Diagnosis not present

## 2019-02-03 DIAGNOSIS — Q999 Chromosomal abnormality, unspecified: Secondary | ICD-10-CM

## 2019-02-03 HISTORY — DX: Chromosomal abnormality, unspecified: Q99.9

## 2019-02-08 ENCOUNTER — Other Ambulatory Visit: Payer: Self-pay | Admitting: Dermatology

## 2019-02-08 DIAGNOSIS — L570 Actinic keratosis: Secondary | ICD-10-CM | POA: Diagnosis not present

## 2019-02-08 DIAGNOSIS — D485 Neoplasm of uncertain behavior of skin: Secondary | ICD-10-CM | POA: Diagnosis not present

## 2019-02-08 DIAGNOSIS — L821 Other seborrheic keratosis: Secondary | ICD-10-CM | POA: Diagnosis not present

## 2019-03-10 DIAGNOSIS — I2510 Atherosclerotic heart disease of native coronary artery without angina pectoris: Secondary | ICD-10-CM | POA: Diagnosis not present

## 2019-03-10 DIAGNOSIS — E119 Type 2 diabetes mellitus without complications: Secondary | ICD-10-CM | POA: Diagnosis not present

## 2019-03-10 DIAGNOSIS — I25118 Atherosclerotic heart disease of native coronary artery with other forms of angina pectoris: Secondary | ICD-10-CM | POA: Diagnosis not present

## 2019-03-10 DIAGNOSIS — Z7984 Long term (current) use of oral hypoglycemic drugs: Secondary | ICD-10-CM | POA: Diagnosis not present

## 2019-03-10 DIAGNOSIS — E782 Mixed hyperlipidemia: Secondary | ICD-10-CM | POA: Diagnosis not present

## 2019-03-10 DIAGNOSIS — I10 Essential (primary) hypertension: Secondary | ICD-10-CM | POA: Diagnosis not present

## 2019-05-03 DIAGNOSIS — I2510 Atherosclerotic heart disease of native coronary artery without angina pectoris: Secondary | ICD-10-CM | POA: Diagnosis not present

## 2019-05-03 DIAGNOSIS — I25118 Atherosclerotic heart disease of native coronary artery with other forms of angina pectoris: Secondary | ICD-10-CM | POA: Diagnosis not present

## 2019-05-03 DIAGNOSIS — I10 Essential (primary) hypertension: Secondary | ICD-10-CM | POA: Diagnosis not present

## 2019-05-03 DIAGNOSIS — E782 Mixed hyperlipidemia: Secondary | ICD-10-CM | POA: Diagnosis not present

## 2019-05-03 DIAGNOSIS — N183 Chronic kidney disease, stage 3 unspecified: Secondary | ICD-10-CM | POA: Diagnosis not present

## 2019-05-03 DIAGNOSIS — Z7984 Long term (current) use of oral hypoglycemic drugs: Secondary | ICD-10-CM | POA: Diagnosis not present

## 2019-05-03 DIAGNOSIS — E119 Type 2 diabetes mellitus without complications: Secondary | ICD-10-CM | POA: Diagnosis not present

## 2019-05-04 DIAGNOSIS — E119 Type 2 diabetes mellitus without complications: Secondary | ICD-10-CM | POA: Diagnosis not present

## 2019-05-04 DIAGNOSIS — I25118 Atherosclerotic heart disease of native coronary artery with other forms of angina pectoris: Secondary | ICD-10-CM | POA: Diagnosis not present

## 2019-05-04 DIAGNOSIS — I10 Essential (primary) hypertension: Secondary | ICD-10-CM | POA: Diagnosis not present

## 2019-05-04 DIAGNOSIS — Z7984 Long term (current) use of oral hypoglycemic drugs: Secondary | ICD-10-CM | POA: Diagnosis not present

## 2019-05-04 DIAGNOSIS — I2510 Atherosclerotic heart disease of native coronary artery without angina pectoris: Secondary | ICD-10-CM | POA: Diagnosis not present

## 2019-05-04 DIAGNOSIS — E782 Mixed hyperlipidemia: Secondary | ICD-10-CM | POA: Diagnosis not present

## 2019-05-04 DIAGNOSIS — N183 Chronic kidney disease, stage 3 unspecified: Secondary | ICD-10-CM | POA: Diagnosis not present

## 2019-05-26 ENCOUNTER — Other Ambulatory Visit: Payer: Self-pay | Admitting: Interventional Cardiology

## 2019-06-08 DIAGNOSIS — I10 Essential (primary) hypertension: Secondary | ICD-10-CM | POA: Diagnosis not present

## 2019-06-08 DIAGNOSIS — E782 Mixed hyperlipidemia: Secondary | ICD-10-CM | POA: Diagnosis not present

## 2019-06-08 DIAGNOSIS — E119 Type 2 diabetes mellitus without complications: Secondary | ICD-10-CM | POA: Diagnosis not present

## 2019-06-08 DIAGNOSIS — I2510 Atherosclerotic heart disease of native coronary artery without angina pectoris: Secondary | ICD-10-CM | POA: Diagnosis not present

## 2019-06-08 DIAGNOSIS — I25118 Atherosclerotic heart disease of native coronary artery with other forms of angina pectoris: Secondary | ICD-10-CM | POA: Diagnosis not present

## 2019-06-08 DIAGNOSIS — N183 Chronic kidney disease, stage 3 unspecified: Secondary | ICD-10-CM | POA: Diagnosis not present

## 2019-08-08 ENCOUNTER — Encounter: Payer: Self-pay | Admitting: *Deleted

## 2019-08-09 ENCOUNTER — Encounter: Payer: Self-pay | Admitting: Dermatology

## 2019-08-09 ENCOUNTER — Other Ambulatory Visit: Payer: Self-pay

## 2019-08-09 ENCOUNTER — Ambulatory Visit: Payer: PPO | Admitting: Dermatology

## 2019-08-09 DIAGNOSIS — D224 Melanocytic nevi of scalp and neck: Secondary | ICD-10-CM

## 2019-08-09 DIAGNOSIS — L570 Actinic keratosis: Secondary | ICD-10-CM

## 2019-08-09 DIAGNOSIS — D489 Neoplasm of uncertain behavior, unspecified: Secondary | ICD-10-CM

## 2019-08-09 DIAGNOSIS — Z85828 Personal history of other malignant neoplasm of skin: Secondary | ICD-10-CM

## 2019-08-09 DIAGNOSIS — L820 Inflamed seborrheic keratosis: Secondary | ICD-10-CM | POA: Diagnosis not present

## 2019-08-09 DIAGNOSIS — D485 Neoplasm of uncertain behavior of skin: Secondary | ICD-10-CM

## 2019-08-09 DIAGNOSIS — D229 Melanocytic nevi, unspecified: Secondary | ICD-10-CM

## 2019-08-09 NOTE — Patient Instructions (Signed)

## 2019-08-10 ENCOUNTER — Encounter: Payer: Self-pay | Admitting: Dermatology

## 2019-08-10 NOTE — Progress Notes (Addendum)
   Follow-Up Visit   Subjective  Jesus Stone is a 64 y.o. male who presents for the following: Follow-up (scalp-no concern with spot that was treated ; does have new spot on right sideburn that he noticed when wife cut his hair).  Growth Location: Right sideburn Duration:  Quality:  Associated Signs/Symptoms: Modifying Factors:  Severity:  Timing: Context: History of skin cancers  Objective  Well appearing patient in no apparent distress; mood and affect are within normal limits.  All skin waist up examined.   Assessment & Plan   Patient Instructions  Biopsy, Surgery (Curettage) & Surgery (Excision) Aftercare Instructions  1. Okay to remove bandage in 24 hours  2. Wash area with soap and water  3. Apply Vaseline to area twice daily until healed (Not Neosporin)  4. Okay to cover with a Band-Aid to decrease the chance of infection or prevent irritation from clothing; also it's okay to uncover lesion at home.  5. Suture instructions: return to our office in 7-10 or 10-14 days for a nurse visit for suture removal. Variable healing with sutures, if pain or itching occurs call our office. It's okay to shower or bathe 24 hours after sutures are given.  6. The following risks may occur after a biopsy, curettage or excision: bleeding, scarring, discoloration, recurrence, infection (redness, yellow drainage, pain or swelling).  7. For questions, concerns and results call our office at [facility] before 4pm & Friday before 3pm. Biopsy results will be available in 1 week.     Neoplasm of uncertain behavior Right Preauricular Area  Skin / nail biopsy Type of biopsy: tangential   Informed consent: discussed and consent obtained   Timeout: patient name, date of birth, surgical site, and procedure verified   Procedure prep:  Patient was prepped and draped in usual sterile fashion Prep type:  Chlorhexidine Anesthesia: the lesion was anesthetized in a standard fashion     Anesthetic:  1% lidocaine w/ epinephrine 1-100,000 local infiltration Instrument used: flexible razor blade   Hemostasis achieved with: ferric subsulfate   Outcome: patient tolerated procedure well   Post-procedure details: wound care instructions given    Specimen 1 - Surgical pathology Differential Diagnosis: bcc vs scc Check Margins: No  AK (actinic keratosis) (2) Mid Frontal Scalp; Left Forehead  No intervention currently indicated  Personal history of skin cancer Mid Frontal Scalp  Nevus Neck - Posterior  Annual skin examination     I, Lavonna Monarch, MD, have reviewed all documentation for this visit.  The documentation on 09/04/19 for the exam, diagnosis, procedures, and orders are all accurate and complete.

## 2019-08-24 DIAGNOSIS — E119 Type 2 diabetes mellitus without complications: Secondary | ICD-10-CM | POA: Diagnosis not present

## 2019-08-24 DIAGNOSIS — I25118 Atherosclerotic heart disease of native coronary artery with other forms of angina pectoris: Secondary | ICD-10-CM | POA: Diagnosis not present

## 2019-08-24 DIAGNOSIS — I2510 Atherosclerotic heart disease of native coronary artery without angina pectoris: Secondary | ICD-10-CM | POA: Diagnosis not present

## 2019-08-24 DIAGNOSIS — E782 Mixed hyperlipidemia: Secondary | ICD-10-CM | POA: Diagnosis not present

## 2019-08-24 DIAGNOSIS — N183 Chronic kidney disease, stage 3 unspecified: Secondary | ICD-10-CM | POA: Diagnosis not present

## 2019-08-24 DIAGNOSIS — I10 Essential (primary) hypertension: Secondary | ICD-10-CM | POA: Diagnosis not present

## 2019-09-04 DIAGNOSIS — I10 Essential (primary) hypertension: Secondary | ICD-10-CM | POA: Diagnosis not present

## 2019-09-04 DIAGNOSIS — Z7984 Long term (current) use of oral hypoglycemic drugs: Secondary | ICD-10-CM | POA: Diagnosis not present

## 2019-09-04 DIAGNOSIS — I25118 Atherosclerotic heart disease of native coronary artery with other forms of angina pectoris: Secondary | ICD-10-CM | POA: Diagnosis not present

## 2019-09-04 DIAGNOSIS — E782 Mixed hyperlipidemia: Secondary | ICD-10-CM | POA: Diagnosis not present

## 2019-09-04 DIAGNOSIS — I2510 Atherosclerotic heart disease of native coronary artery without angina pectoris: Secondary | ICD-10-CM | POA: Diagnosis not present

## 2019-09-04 DIAGNOSIS — E119 Type 2 diabetes mellitus without complications: Secondary | ICD-10-CM | POA: Diagnosis not present

## 2019-09-04 DIAGNOSIS — N183 Chronic kidney disease, stage 3 unspecified: Secondary | ICD-10-CM | POA: Diagnosis not present

## 2019-10-05 DIAGNOSIS — R1013 Epigastric pain: Secondary | ICD-10-CM | POA: Diagnosis not present

## 2019-10-05 DIAGNOSIS — Z8673 Personal history of transient ischemic attack (TIA), and cerebral infarction without residual deficits: Secondary | ICD-10-CM | POA: Diagnosis not present

## 2019-10-05 DIAGNOSIS — Z8719 Personal history of other diseases of the digestive system: Secondary | ICD-10-CM | POA: Diagnosis not present

## 2019-10-06 ENCOUNTER — Other Ambulatory Visit: Payer: Self-pay | Admitting: Gastroenterology

## 2019-10-06 DIAGNOSIS — R748 Abnormal levels of other serum enzymes: Secondary | ICD-10-CM

## 2019-10-12 DIAGNOSIS — R899 Unspecified abnormal finding in specimens from other organs, systems and tissues: Secondary | ICD-10-CM | POA: Diagnosis not present

## 2019-10-19 ENCOUNTER — Ambulatory Visit
Admission: RE | Admit: 2019-10-19 | Discharge: 2019-10-19 | Disposition: A | Discharge status: Home / Self Care | Payer: PPO | Source: Ambulatory Visit | Attending: Gastroenterology | Admitting: Gastroenterology

## 2019-10-19 DIAGNOSIS — I700 Atherosclerosis of aorta: Secondary | ICD-10-CM | POA: Diagnosis not present

## 2019-10-19 DIAGNOSIS — R109 Unspecified abdominal pain: Secondary | ICD-10-CM | POA: Diagnosis not present

## 2019-10-19 DIAGNOSIS — R748 Abnormal levels of other serum enzymes: Secondary | ICD-10-CM | POA: Diagnosis not present

## 2019-10-19 DIAGNOSIS — K8689 Other specified diseases of pancreas: Secondary | ICD-10-CM | POA: Diagnosis not present

## 2019-10-19 MED ORDER — IOPAMIDOL (ISOVUE-300) INJECTION 61%
100.0000 mL | Freq: Once | INTRAVENOUS | Status: AC | PRN
  Administered 2019-10-19: 100 mL via INTRAVENOUS

## 2019-10-26 DIAGNOSIS — R899 Unspecified abnormal finding in specimens from other organs, systems and tissues: Secondary | ICD-10-CM | POA: Diagnosis not present

## 2019-11-02 DIAGNOSIS — N183 Chronic kidney disease, stage 3 unspecified: Secondary | ICD-10-CM | POA: Diagnosis not present

## 2019-11-02 DIAGNOSIS — E119 Type 2 diabetes mellitus without complications: Secondary | ICD-10-CM | POA: Diagnosis not present

## 2019-11-02 DIAGNOSIS — I10 Essential (primary) hypertension: Secondary | ICD-10-CM | POA: Diagnosis not present

## 2019-11-02 DIAGNOSIS — E782 Mixed hyperlipidemia: Secondary | ICD-10-CM | POA: Diagnosis not present

## 2019-11-02 DIAGNOSIS — I2510 Atherosclerotic heart disease of native coronary artery without angina pectoris: Secondary | ICD-10-CM | POA: Diagnosis not present

## 2019-11-02 DIAGNOSIS — I25118 Atherosclerotic heart disease of native coronary artery with other forms of angina pectoris: Secondary | ICD-10-CM | POA: Diagnosis not present

## 2019-11-30 DIAGNOSIS — E782 Mixed hyperlipidemia: Secondary | ICD-10-CM | POA: Diagnosis not present

## 2019-11-30 DIAGNOSIS — I10 Essential (primary) hypertension: Secondary | ICD-10-CM | POA: Diagnosis not present

## 2019-11-30 DIAGNOSIS — N183 Chronic kidney disease, stage 3 unspecified: Secondary | ICD-10-CM | POA: Diagnosis not present

## 2019-11-30 DIAGNOSIS — I2510 Atherosclerotic heart disease of native coronary artery without angina pectoris: Secondary | ICD-10-CM | POA: Diagnosis not present

## 2019-11-30 DIAGNOSIS — E119 Type 2 diabetes mellitus without complications: Secondary | ICD-10-CM | POA: Diagnosis not present

## 2019-11-30 DIAGNOSIS — I25118 Atherosclerotic heart disease of native coronary artery with other forms of angina pectoris: Secondary | ICD-10-CM | POA: Diagnosis not present

## 2019-12-08 DIAGNOSIS — E119 Type 2 diabetes mellitus without complications: Secondary | ICD-10-CM | POA: Diagnosis not present

## 2019-12-08 DIAGNOSIS — I10 Essential (primary) hypertension: Secondary | ICD-10-CM | POA: Diagnosis not present

## 2019-12-08 DIAGNOSIS — I25118 Atherosclerotic heart disease of native coronary artery with other forms of angina pectoris: Secondary | ICD-10-CM | POA: Diagnosis not present

## 2019-12-08 DIAGNOSIS — N183 Chronic kidney disease, stage 3 unspecified: Secondary | ICD-10-CM | POA: Diagnosis not present

## 2019-12-08 DIAGNOSIS — I2510 Atherosclerotic heart disease of native coronary artery without angina pectoris: Secondary | ICD-10-CM | POA: Diagnosis not present

## 2019-12-08 DIAGNOSIS — E782 Mixed hyperlipidemia: Secondary | ICD-10-CM | POA: Diagnosis not present

## 2019-12-31 NOTE — Progress Notes (Signed)
Cardiology Office Note:    Date:  01/02/2020   ID:  Jesus, Stone 1955-07-24, MRN [id number]  PCP:  Josetta Huddle, MD  Cardiologist:  Sinclair Grooms, MD   Referring MD: Josetta Huddle, MD   Chief Complaint  Patient presents with  . Coronary Artery Disease    History of Present Illness:    Jesus Stone is a 64 y.o. male with a history of CAD s/p BMS in 2000 and DES in 2017 for in-stent restenosis of ramus, hypertension, hyperlipidemia, type 2 diabetes mellitus, obstructive sleep apnea, and obesity.  He has no upcoming appointment with Dr. Mertha Finders, his primary care physician.  He has no cardiology complaints.  He specifically denies chest pain, orthopnea, PND, edema, palpitations, and syncope.  He is active and having no difficulty with physical activity.  He stopped taking aspirin earlier this year.  He was worried that intestinal/abdominal discomfort was related to aspirin.  Past Medical History:  Diagnosis Date  . Acute sinusitis, unspecified   . Anal polyp   . Bell's palsy   . CAD (coronary artery disease)    a. BMSx2 to ramus 2000  b. [place] s/p DES to ramus for instent restenosis 04/26/15  . 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 Crohn's disease ([place])   . Diabetes mellitus, type 2 ([place])   . Gout, unspecified   . HLD (hyperlipidemia)   . HTN (hypertension)   . Nontoxic multinodular goiter   . Obesity   . Periodic limb movement disorder   . Sleep apnea   . Squamous cell carcinoma of skin 04/04/2014   well diff-right crown (CX35FU)   . Squamous cell carcinoma of skin 11/08/2018   left medial scalp (MOHS)  . Squamous cell carcinoma of skin 11/08/2018   KA-left preauricular- [place]  . Thyroid nodule   . TIA (transient ischemic attack)   . Unspecified vitamin D deficiency     Past Surgical History:  Procedure Laterality Date  . APPENDECTOMY  1964  . CARDIAC CATHETERIZATION N/A 04/26/2015   Procedure: Left Heart Cath and Coronary Angiography;   Surgeon: Peter M Martinique, MD;  Location: [facility];  Service: Cardiovascular;  Laterality: N/A;  . CARDIAC CATHETERIZATION N/A 04/26/2015   Procedure: Intravascular Pressure Wire/FFR Study;  Surgeon: Peter M Martinique, MD;  Location: [facility];  Service: Cardiovascular;  Laterality: N/A;  . CARDIAC CATHETERIZATION N/A 04/26/2015   Procedure: Coronary Stent Intervention;  Surgeon: Peter M Martinique, MD;  Location: [facility];  Service: Cardiovascular;  Laterality: N/A;  . Coronary artery stent placment  03/1998  . HEEL SPUR EXCISION Right 2008  . RECTAL POLYPECTOMY  2008, 2009  . TESTICLE SURGERY  1967    Current Medications: Current Meds  Medication Sig  . Alpha-D-Galactosidase (BEANO) TABS Take 2 tablets by mouth every morning.  . APRISO 0.375 g 24 hr capsule Take 1,500 mg by mouth daily. 3 capsules daily  . Cholecalciferol (VITAMIN D3) 2000 UNITS TABS Take 1 capsule by mouth 2 (two) times daily.   Marland Kitchen gemfibrozil (LOPID) 600 MG tablet Take 600 mg by mouth 2 (two) times daily.  . mercaptopurine (PURINETHOL) 50 MG tablet Take 1 tablet by mouth daily.   . metFORMIN (GLUCOPHAGE) 500 MG tablet Take 250-500 mg by mouth in the morning, at noon, and at bedtime. Take 500mg  in the morning and 250mg  in the evening  . nitroGLYCERIN (NITROSTAT) 0.4 MG SL tablet Place 1 tablet (0.4 mg total)  under the tongue every 5 (five) minutes as needed for chest pain (x 3 doses).  . Omega-3 1000 MG CAPS Take 1 capsule by mouth 2 (two) times daily.   . rosuvastatin (CRESTOR) 20 MG tablet TAKE ONE TABLET BY MOUTH ONE TIME DAILY   . valsartan-hydrochlorothiazide (DIOVAN-HCT) 320-12.5 MG tablet TAKE ONE TABLET BY MOUTH ONE TIME DAILY      Allergies:   Lipitor [atorvastatin] and Simvastatin   Social History   Socioeconomic History  . Marital status: Married    Spouse name: sharon  . Number of children: 2  . Years of education: college  . Highest education level: Not on file  Occupational History   . Occupation: [employer]    Employer: [employer]  Tobacco Use  . Smoking status: Former Smoker    Quit date: 03/01/1984    Years since quitting: 35.8  . Smokeless tobacco: Never Used  Substance and Sexual Activity  . Alcohol use: No    Alcohol/week: 0.0 standard drinks  . Drug use: No  . Sexual activity: Yes  Other Topics Concern  . Not on file  Social History Narrative   Right handed male   Social Determinants of Health   Financial Resource Strain:   . Difficulty of Paying Living Expenses: Not on file  Food Insecurity:   . Worried About Charity fundraiser in the Last Year: Not on file  . Ran Out of Food in the Last Year: Not on file  Transportation Needs:   . Lack of Transportation (Medical): Not on file  . Lack of Transportation (Non-Medical): Not on file  Physical Activity:   . Days of Exercise per Week: Not on file  . Minutes of Exercise per Session: Not on file  Stress:   . Feeling of Stress : Not on file  Social Connections:   . Frequency of Communication with Friends and Family: Not on file  . Frequency of Social Gatherings with Friends and Family: Not on file  . Attends Religious Services: Not on file  . Active Member of Clubs or Organizations: Not on file  . Attends Archivist Meetings: Not on file  . Marital Status: Not on file     Family History: The patient's family history includes Cancer in his maternal grandmother, mother, and paternal aunt; Diabetes in his sister.  ROS:   Please see the history of present illness.    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  He has been having some abdominal discomfort for which Dr. Shirley Muscat has done GI work-up and found no specific answer.  The patient stopped taking aspirin on his own thinking that this was contributing to his abdominal discomfort.  He is on a 20-month course of  antibiotic therapy.  All other systems reviewed and are negative.  EKGs/Labs/Other Studies Reviewed:    The following studies were reviewed today: No new data  EKG:  EKG sinus bradycardia, first-degree AV block, and otherwise normal.  Recent Labs: No results found for requested labs within last 8760 hours.  Recent Lipid Panel    Component Value Date/Time   CHOL 103 06/10/2016 0731   TRIG 222 (H) 06/10/2016 0731   HDL 33 (L) 06/10/2016 0731   CHOLHDL 3.1 06/10/2016 0731   CHOLHDL 3.3 04/26/2015 0222   VLDL 44 (H) 04/26/2015 0222   LDLCALC 26 06/10/2016 0731    Physical Exam:  VS:  BP 138/74   Pulse (!) 45   Ht 5\' 11"  (1.803 m)   Wt 219 lb (99.3 kg)   SpO2 98%   BMI 30.54 kg/m     Wt Readings from Last 3 Encounters:  01/02/20 219 lb (99.3 kg)  12/07/18 228 lb (103.4 kg)  12/06/17 237 lb (107.5 kg)     GEN: He has lost weight.. No acute distress HEENT: Normal NECK: No JVD. LYMPHATICS: No lymphadenopathy CARDIAC:  RRR without murmur, gallop, or edema. VASCULAR:  Normal Pulses. No bruits. RESPIRATORY:  Clear to auscultation without rales, wheezing or rhonchi  ABDOMEN: Soft, non-tender, non-distended, No pulsatile mass, MUSCULOSKELETAL: No deformity  SKIN: Warm and dry NEUROLOGIC:  Alert and oriented x 3 PSYCHIATRIC:  Normal affect   ASSESSMENT:    1. Coronary artery disease involving native coronary artery of native heart with angina pectoris ([place])   2. Essential hypertension   3. Hyperlipidemia, unspecified hyperlipidemia type   4. Type 2 diabetes mellitus with complication, without long-term current use of insulin ([place])   5. Sleep apnea, unspecified type   6. Class 1 obesity with body mass index (BMI) of 31.0 to 31.9 in adult, unspecified obesity type, unspecified whether serious comorbidity present   7. Educated about COVID-19 virus infection    PLAN:    In order of problems listed above:  1. Secondary prevention discussed.  150 minutes of moderate  aerobic activity is encouraged.   2. Target 130/80 mmHg.  We discussed 2.3 g sodium restriction. 3. Continue high intensity Crestor 20 mg/day. 4. Hemoglobin A1c was 6.81-year ago.  He will be reassessed by Dr. Inda Merlin within the next 2 weeks. 5. Sleeping well. 6. Aerobic activity discussed. 7. He is vaccinated.  Overall education and awareness concerning primary/secondary risk prevention was discussed in detail: LDL less than 70, hemoglobin A1c less than 7, blood pressure target less than 130/80 mmHg, >150 minutes of moderate aerobic activity per week, avoidance of smoking, weight control (via diet and exercise), and continued surveillance/management of/for obstructive sleep apnea.    Medication Adjustments/Labs and Tests Ordered: Current medicines are reviewed at length with the patient today.  Concerns regarding medicines are outlined above.  Orders Placed This Encounter  Procedures  . EKG 12-Lead   No orders of the defined types were placed in this encounter.   There are no Patient Instructions on file for this visit.   Signed, Sinclair Grooms, MD  01/02/2020 8:04 AM    [place]

## 2020-01-02 ENCOUNTER — Other Ambulatory Visit: Payer: Self-pay

## 2020-01-02 ENCOUNTER — Ambulatory Visit: Payer: PPO | Admitting: Interventional Cardiology

## 2020-01-02 ENCOUNTER — Encounter: Payer: Self-pay | Admitting: Interventional Cardiology

## 2020-01-02 VITALS — BP 138/74 | HR 45 | Ht 71.0 in | Wt 219.0 lb

## 2020-01-02 DIAGNOSIS — E669 Obesity, unspecified: Secondary | ICD-10-CM | POA: Diagnosis not present

## 2020-01-02 DIAGNOSIS — E119 Type 2 diabetes mellitus without complications: Secondary | ICD-10-CM | POA: Diagnosis not present

## 2020-01-02 DIAGNOSIS — I10 Essential (primary) hypertension: Secondary | ICD-10-CM | POA: Diagnosis not present

## 2020-01-02 DIAGNOSIS — Z6831 Body mass index (BMI) 31.0-31.9, adult: Secondary | ICD-10-CM

## 2020-01-02 DIAGNOSIS — I25119 Atherosclerotic heart disease of native coronary artery with unspecified angina pectoris: Secondary | ICD-10-CM

## 2020-01-02 DIAGNOSIS — E785 Hyperlipidemia, unspecified: Secondary | ICD-10-CM

## 2020-01-02 DIAGNOSIS — G4730 Sleep apnea, unspecified: Secondary | ICD-10-CM | POA: Diagnosis not present

## 2020-01-02 DIAGNOSIS — Z7189 Other specified counseling: Secondary | ICD-10-CM | POA: Diagnosis not present

## 2020-01-02 DIAGNOSIS — E118 Type 2 diabetes mellitus with unspecified complications: Secondary | ICD-10-CM | POA: Diagnosis not present

## 2020-01-02 MED ORDER — ASPIRIN EC 81 MG PO TBEC
81.0000 mg | DELAYED_RELEASE_TABLET | ORAL | 3 refills | Status: AC
Start: 2020-01-03 — End: ?

## 2020-01-02 NOTE — Patient Instructions (Signed)
Medication Instructions:  1) START Aspirin 81mg  once daily on Monday, Wednesday and Friday  *If you need a refill on your cardiac medications before your next appointment, please call your pharmacy*   Lab Work: None  If you have labs (blood work) drawn today and your tests are completely normal, you will receive your results only by: Marland Kitchen MyChart Message (if you have MyChart) OR . A paper copy in the mail If you have any lab test that is abnormal or we need to change your treatment, we will call you to review the results.   Testing/Procedures: None   Follow-Up: At [facility], you and your health needs are our priority.  As part of our continuing mission to provide you with exceptional heart care, we have created designated Provider Care Teams.  These Care Teams include your primary Cardiologist (physician) and Advanced Practice Providers (APPs -  Physician Assistants and Nurse Practitioners) who all work together to provide you with the care you need, when you need it.  We recommend signing up for the patient portal called "MyChart".  Sign up information is provided on this After Visit Summary.  MyChart is used to connect with patients for Virtual Visits (Telemedicine).  Patients are able to view lab/test results, encounter notes, upcoming appointments, etc.  Non-urgent messages can be sent to your provider as well.   To learn more about what you can do with MyChart, go to [email or web address].    Your next appointment:   1 year(s)  The format for your next appointment:   In Person  Provider:   You may see Sinclair Grooms, MD or one of the following Advanced Practice Providers on your designated Care Team:    Truitt Merle, NP  Cecilie Kicks, NP  Kathyrn Drown, NP    Other Instructions

## 2020-01-11 DIAGNOSIS — H40013 Open angle with borderline findings, low risk, bilateral: Secondary | ICD-10-CM | POA: Diagnosis not present

## 2020-01-11 DIAGNOSIS — E119 Type 2 diabetes mellitus without complications: Secondary | ICD-10-CM | POA: Diagnosis not present

## 2020-01-11 DIAGNOSIS — H2513 Age-related nuclear cataract, bilateral: Secondary | ICD-10-CM | POA: Diagnosis not present

## 2020-01-11 DIAGNOSIS — H524 Presbyopia: Secondary | ICD-10-CM | POA: Diagnosis not present

## 2020-01-11 DIAGNOSIS — H25013 Cortical age-related cataract, bilateral: Secondary | ICD-10-CM | POA: Diagnosis not present

## 2020-01-16 DIAGNOSIS — E119 Type 2 diabetes mellitus without complications: Secondary | ICD-10-CM | POA: Diagnosis not present

## 2020-01-16 DIAGNOSIS — I25118 Atherosclerotic heart disease of native coronary artery with other forms of angina pectoris: Secondary | ICD-10-CM | POA: Diagnosis not present

## 2020-01-16 DIAGNOSIS — Z79899 Other long term (current) drug therapy: Secondary | ICD-10-CM | POA: Diagnosis not present

## 2020-01-16 DIAGNOSIS — Z0001 Encounter for general adult medical examination with abnormal findings: Secondary | ICD-10-CM | POA: Diagnosis not present

## 2020-01-16 DIAGNOSIS — Z955 Presence of coronary angioplasty implant and graft: Secondary | ICD-10-CM | POA: Diagnosis not present

## 2020-01-16 DIAGNOSIS — Z125 Encounter for screening for malignant neoplasm of prostate: Secondary | ICD-10-CM | POA: Diagnosis not present

## 2020-01-16 DIAGNOSIS — E559 Vitamin D deficiency, unspecified: Secondary | ICD-10-CM | POA: Diagnosis not present

## 2020-01-16 DIAGNOSIS — E782 Mixed hyperlipidemia: Secondary | ICD-10-CM | POA: Diagnosis not present

## 2020-01-16 DIAGNOSIS — M25561 Pain in right knee: Secondary | ICD-10-CM | POA: Diagnosis not present

## 2020-01-16 DIAGNOSIS — E669 Obesity, unspecified: Secondary | ICD-10-CM | POA: Diagnosis not present

## 2020-01-16 DIAGNOSIS — M109 Gout, unspecified: Secondary | ICD-10-CM | POA: Diagnosis not present

## 2020-01-16 DIAGNOSIS — E538 Deficiency of other specified B group vitamins: Secondary | ICD-10-CM | POA: Diagnosis not present

## 2020-01-16 DIAGNOSIS — R519 Headache, unspecified: Secondary | ICD-10-CM | POA: Diagnosis not present

## 2020-01-16 DIAGNOSIS — I10 Essential (primary) hypertension: Secondary | ICD-10-CM | POA: Diagnosis not present

## 2020-01-16 DIAGNOSIS — N183 Chronic kidney disease, stage 3 unspecified: Secondary | ICD-10-CM | POA: Diagnosis not present

## 2020-01-16 DIAGNOSIS — K5080 Crohn's disease of both small and large intestine without complications: Secondary | ICD-10-CM | POA: Diagnosis not present

## 2020-01-19 DIAGNOSIS — M79604 Pain in right leg: Secondary | ICD-10-CM | POA: Diagnosis not present

## 2020-01-31 DIAGNOSIS — I10 Essential (primary) hypertension: Secondary | ICD-10-CM | POA: Diagnosis not present

## 2020-01-31 DIAGNOSIS — E119 Type 2 diabetes mellitus without complications: Secondary | ICD-10-CM | POA: Diagnosis not present

## 2020-01-31 DIAGNOSIS — I25118 Atherosclerotic heart disease of native coronary artery with other forms of angina pectoris: Secondary | ICD-10-CM | POA: Diagnosis not present

## 2020-01-31 DIAGNOSIS — H35033 Hypertensive retinopathy, bilateral: Secondary | ICD-10-CM | POA: Diagnosis not present

## 2020-01-31 DIAGNOSIS — E782 Mixed hyperlipidemia: Secondary | ICD-10-CM | POA: Diagnosis not present

## 2020-01-31 DIAGNOSIS — N183 Chronic kidney disease, stage 3 unspecified: Secondary | ICD-10-CM | POA: Diagnosis not present

## 2020-01-31 DIAGNOSIS — I2510 Atherosclerotic heart disease of native coronary artery without angina pectoris: Secondary | ICD-10-CM | POA: Diagnosis not present

## 2020-02-13 ENCOUNTER — Other Ambulatory Visit: Payer: Self-pay | Admitting: Interventional Cardiology

## 2020-02-14 ENCOUNTER — Other Ambulatory Visit: Payer: Self-pay | Admitting: Interventional Cardiology

## 2020-03-04 DIAGNOSIS — E119 Type 2 diabetes mellitus without complications: Secondary | ICD-10-CM | POA: Diagnosis not present

## 2020-03-05 DIAGNOSIS — N183 Chronic kidney disease, stage 3 unspecified: Secondary | ICD-10-CM | POA: Diagnosis not present

## 2020-03-05 DIAGNOSIS — I25118 Atherosclerotic heart disease of native coronary artery with other forms of angina pectoris: Secondary | ICD-10-CM | POA: Diagnosis not present

## 2020-03-05 DIAGNOSIS — I10 Essential (primary) hypertension: Secondary | ICD-10-CM | POA: Diagnosis not present

## 2020-03-05 DIAGNOSIS — E119 Type 2 diabetes mellitus without complications: Secondary | ICD-10-CM | POA: Diagnosis not present

## 2020-03-05 DIAGNOSIS — E782 Mixed hyperlipidemia: Secondary | ICD-10-CM | POA: Diagnosis not present

## 2020-03-05 DIAGNOSIS — H35033 Hypertensive retinopathy, bilateral: Secondary | ICD-10-CM | POA: Diagnosis not present

## 2020-03-05 DIAGNOSIS — I2510 Atherosclerotic heart disease of native coronary artery without angina pectoris: Secondary | ICD-10-CM | POA: Diagnosis not present

## 2020-03-05 DIAGNOSIS — G8929 Other chronic pain: Secondary | ICD-10-CM | POA: Diagnosis not present

## 2020-03-11 ENCOUNTER — Other Ambulatory Visit: Payer: Self-pay

## 2020-03-11 ENCOUNTER — Ambulatory Visit: Payer: PPO | Admitting: Dermatology

## 2020-03-11 ENCOUNTER — Encounter: Payer: Self-pay | Admitting: Dermatology

## 2020-03-11 DIAGNOSIS — Z1283 Encounter for screening for malignant neoplasm of skin: Secondary | ICD-10-CM

## 2020-03-11 DIAGNOSIS — Z85828 Personal history of other malignant neoplasm of skin: Secondary | ICD-10-CM | POA: Diagnosis not present

## 2020-03-11 DIAGNOSIS — L570 Actinic keratosis: Secondary | ICD-10-CM

## 2020-03-13 DIAGNOSIS — M25561 Pain in right knee: Secondary | ICD-10-CM | POA: Diagnosis not present

## 2020-03-13 DIAGNOSIS — G8929 Other chronic pain: Secondary | ICD-10-CM | POA: Diagnosis not present

## 2020-03-23 ENCOUNTER — Encounter: Payer: Self-pay | Admitting: Dermatology

## 2020-03-23 NOTE — Progress Notes (Signed)
   Follow-Up Visit   Subjective  Jesus Stone is a 65 y.o. male who presents for the following: Annual Exam (No new concerns).  General skin check Location:  Duration:  Quality:  Associated Signs/Symptoms: Modifying Factors:  Severity:  Timing: Context: History of skin cancer and multiple actinic keratoses  Objective  Well appearing patient in no apparent distress; mood and affect are within normal limits. Objective  Left Breast: No sign residual  Objective  Waist up: Waist up skin examination, no atypical moles, melanoma, nonmelanoma skin cancer  Objective  Head - Anterior (Face): Pink hornlike crusts, mostly small and potentially treatable with red light PDT   All skin waist up examined.   Assessment & Plan    History of squamous cell carcinoma of skin Left Breast  Yearly skin check  Encounter for screening for malignant neoplasm of skin Waist up  Yearly skin check  AK (actinic keratosis) Head - Anterior (Face)  Schedule red light PDT with debridement per ST      I, Lavonna Monarch, MD, have reviewed all documentation for this visit.  The documentation on 03/23/20 for the exam, diagnosis, procedures, and orders are all accurate and complete.

## 2020-03-27 ENCOUNTER — Other Ambulatory Visit: Payer: Self-pay

## 2020-03-27 ENCOUNTER — Ambulatory Visit: Payer: PPO | Admitting: Dermatology

## 2020-03-27 DIAGNOSIS — L570 Actinic keratosis: Secondary | ICD-10-CM | POA: Diagnosis not present

## 2020-03-27 MED ORDER — AMINOLEVULINIC ACID HCL 10 % EX GEL
2000.0000 mg | Freq: Once | CUTANEOUS | Status: AC
  Administered 2020-03-27: 2000 mg via TOPICAL

## 2020-03-27 NOTE — Progress Notes (Signed)
Photodynamic Therapy Procedure Note Diagnosis: Actinic keratosis Location: scalp Informed Consent: Discussed risks (burning, pain, redness, peeling, severe sunburn-like reaction, blistering, discoloration, lack of resolution) and benefits of the procedure, as well as the alternatives. Informed consent was obtained. Preparation:Debridgement done by Dr. Denna Haggard.  After cleansing the skin, the area to be treated was coated with Levulan.  This was allowed to sit on the skin for 1.5 hours. Procedure Details: The patient was placed under the light source with appropriate eye protection for  20 minutes. After completing the treatment, the patient applied sunscreen to the treated areas. Patient tolerated the procedure well Plan: Avoid any sun exposure for the next 24 hours. Wear sunscreen daily for the next week. Observe normal sun precautions thereafter. Recommend OTC analgesia as needed for pain. Follow-up in 10 weeks.

## 2020-03-27 NOTE — Patient Instructions (Signed)

## 2020-04-01 DIAGNOSIS — E119 Type 2 diabetes mellitus without complications: Secondary | ICD-10-CM | POA: Diagnosis not present

## 2020-04-06 ENCOUNTER — Encounter: Payer: Self-pay | Admitting: Dermatology

## 2020-04-06 NOTE — Progress Notes (Signed)
° °  Follow-Up Visit   Subjective  Jesus Stone is a 65 y.o. male who presents for the following: Photodynamic Therapy (Here for light treatment on scalp).  Actinic keratoses, diffuse and hypertrophic Location:  Duration:  Quality:  Associated Signs/Symptoms: Modifying Factors:  Severity:  Timing: Context: For PDT therapy  Objective  Well appearing patient in no apparent distress; mood and affect are within normal limits. Objective  Mid Frontal Scalp: Dozen plus pink granular crusts half of which are hypertrophic.  Thick lesions debrided with a combination of curettage plus abrasive tape.    A focused examination was performed including Head and neck. Relevant physical exam findings are noted in the Assessment and Plan.   Assessment & Plan    AK (actinic keratosis) Mid Frontal Scalp  Photodynamic therapy - Mid Frontal Scalp Procedure discussed: discussed risks, benefits, side effects. and alternatives   Prep: site scrubbed/prepped with acetone   Debridement needed: Yes (done by dr Yuvan Medinger)   Location:  Scalp Type of treatment:  Red light Aminolevulinic Acid (see MAR for details): Ameluz Amount of Ameluz (mg):  2000 Incubation time (minutes):  90 Number of minutes under lamp:  20 Cooling:  Fan Post-procedure details: sunscreen applied and aftercare instructions given to patient    Aminolevulinic Acid HCl 10 % GEL 2,000 mg - Mid Frontal Scalp      I, Lavonna Monarch, MD, have reviewed all documentation for this visit.  The documentation on 04/06/20 for the exam, diagnosis, procedures, and orders are all accurate and complete.I, Lavonna Monarch, MD, have reviewed all documentation for this visit. The documentation on 04/06/20 for the exam, diagnosis, procedures, and orders are all accurate and complete.

## 2020-05-02 DIAGNOSIS — E119 Type 2 diabetes mellitus without complications: Secondary | ICD-10-CM | POA: Diagnosis not present

## 2020-05-06 DIAGNOSIS — E119 Type 2 diabetes mellitus without complications: Secondary | ICD-10-CM | POA: Diagnosis not present

## 2020-05-06 DIAGNOSIS — H35033 Hypertensive retinopathy, bilateral: Secondary | ICD-10-CM | POA: Diagnosis not present

## 2020-05-06 DIAGNOSIS — G8929 Other chronic pain: Secondary | ICD-10-CM | POA: Diagnosis not present

## 2020-05-06 DIAGNOSIS — I2510 Atherosclerotic heart disease of native coronary artery without angina pectoris: Secondary | ICD-10-CM | POA: Diagnosis not present

## 2020-05-06 DIAGNOSIS — N183 Chronic kidney disease, stage 3 unspecified: Secondary | ICD-10-CM | POA: Diagnosis not present

## 2020-05-06 DIAGNOSIS — I25118 Atherosclerotic heart disease of native coronary artery with other forms of angina pectoris: Secondary | ICD-10-CM | POA: Diagnosis not present

## 2020-05-06 DIAGNOSIS — E782 Mixed hyperlipidemia: Secondary | ICD-10-CM | POA: Diagnosis not present

## 2020-05-06 DIAGNOSIS — I10 Essential (primary) hypertension: Secondary | ICD-10-CM | POA: Diagnosis not present

## 2020-05-31 DIAGNOSIS — E119 Type 2 diabetes mellitus without complications: Secondary | ICD-10-CM | POA: Diagnosis not present

## 2020-06-05 ENCOUNTER — Other Ambulatory Visit: Payer: Self-pay

## 2020-06-05 ENCOUNTER — Encounter: Payer: Self-pay | Admitting: Dermatology

## 2020-06-05 ENCOUNTER — Ambulatory Visit: Payer: PPO | Admitting: Dermatology

## 2020-06-05 DIAGNOSIS — Z1283 Encounter for screening for malignant neoplasm of skin: Secondary | ICD-10-CM | POA: Diagnosis not present

## 2020-06-05 DIAGNOSIS — L570 Actinic keratosis: Secondary | ICD-10-CM | POA: Diagnosis not present

## 2020-06-10 ENCOUNTER — Ambulatory Visit
Admission: RE | Admit: 2020-06-10 | Discharge: 2020-06-10 | Disposition: A | Discharge status: Home / Self Care | Payer: PPO | Source: Ambulatory Visit | Attending: Internal Medicine | Admitting: Internal Medicine

## 2020-06-10 ENCOUNTER — Other Ambulatory Visit: Payer: Self-pay | Admitting: Internal Medicine

## 2020-06-10 DIAGNOSIS — M25519 Pain in unspecified shoulder: Secondary | ICD-10-CM | POA: Diagnosis not present

## 2020-06-10 DIAGNOSIS — M542 Cervicalgia: Secondary | ICD-10-CM | POA: Diagnosis not present

## 2020-06-10 DIAGNOSIS — M47812 Spondylosis without myelopathy or radiculopathy, cervical region: Secondary | ICD-10-CM | POA: Diagnosis not present

## 2020-06-12 DIAGNOSIS — M542 Cervicalgia: Secondary | ICD-10-CM | POA: Diagnosis not present

## 2020-06-16 ENCOUNTER — Encounter: Payer: Self-pay | Admitting: Dermatology

## 2020-06-16 NOTE — Progress Notes (Signed)
   Follow-Up Visit   Subjective  Jesus Stone is a 65 y.o. male who presents for the following: Annual Exam (No new concerns).  Actinic keratosis Location:  Duration:  Quality:  Associated Signs/Symptoms: Modifying Factors: PDT Severity:  Timing: Context:   Objective  Well appearing patient in no apparent distress; mood and affect are within normal limits. Objective  Left Temporal Scalp, Mid Parietal Scalp (4), Right Buccal Cheek, Right Forehead: Perhaps 75% reduction in actinic keratoses.  No uncovered skin cancers.    A focused examination was performed including Head and neck. Relevant physical exam findings are noted in the Assessment and Plan.   Assessment & Plan    AK (actinic keratosis) (7) Mid Parietal Scalp (4); Right Buccal Cheek; Right Forehead; Left Temporal Scalp  Patient had a good reaction to PDT-still has roughly a dozen small actinic keratoses.  Will discuss repeating PDT this November      I, Lavonna Monarch, MD, have reviewed all documentation for this visit.  The documentation on 06/16/20 for the exam, diagnosis, procedures, and orders are all accurate and complete.

## 2020-06-26 DIAGNOSIS — G8929 Other chronic pain: Secondary | ICD-10-CM | POA: Diagnosis not present

## 2020-06-26 DIAGNOSIS — E119 Type 2 diabetes mellitus without complications: Secondary | ICD-10-CM | POA: Diagnosis not present

## 2020-06-26 DIAGNOSIS — H35033 Hypertensive retinopathy, bilateral: Secondary | ICD-10-CM | POA: Diagnosis not present

## 2020-06-26 DIAGNOSIS — I25118 Atherosclerotic heart disease of native coronary artery with other forms of angina pectoris: Secondary | ICD-10-CM | POA: Diagnosis not present

## 2020-06-26 DIAGNOSIS — N183 Chronic kidney disease, stage 3 unspecified: Secondary | ICD-10-CM | POA: Diagnosis not present

## 2020-06-26 DIAGNOSIS — I2510 Atherosclerotic heart disease of native coronary artery without angina pectoris: Secondary | ICD-10-CM | POA: Diagnosis not present

## 2020-06-26 DIAGNOSIS — I10 Essential (primary) hypertension: Secondary | ICD-10-CM | POA: Diagnosis not present

## 2020-06-26 DIAGNOSIS — E782 Mixed hyperlipidemia: Secondary | ICD-10-CM | POA: Diagnosis not present

## 2020-07-02 DIAGNOSIS — E119 Type 2 diabetes mellitus without complications: Secondary | ICD-10-CM | POA: Diagnosis not present

## 2020-07-11 DIAGNOSIS — R1013 Epigastric pain: Secondary | ICD-10-CM | POA: Diagnosis not present

## 2020-07-11 DIAGNOSIS — Z8673 Personal history of transient ischemic attack (TIA), and cerebral infarction without residual deficits: Secondary | ICD-10-CM | POA: Diagnosis not present

## 2020-07-11 DIAGNOSIS — Z8719 Personal history of other diseases of the digestive system: Secondary | ICD-10-CM | POA: Diagnosis not present

## 2020-07-11 NOTE — Progress Notes (Signed)
   Follow-Up Visit   Subjective  Jesus Stone is a 65 y.o. male who presents for the following: Annual Exam (No new concerns).  Annual skin check plus follow-up for PDT Location:  Duration:  Quality:  Associated Signs/Symptoms: Modifying Factors:  Severity:  Timing: Context:   Objective  Well appearing patient in no apparent distress; mood and affect are within normal limits. Left Temporal Scalp, Mid Parietal Scalp (4), Right Buccal Cheek, Right Forehead Perhaps 75% reduction in actinic keratoses after PDT.Marland Kitchen  No uncovered skin cancers.  Neck - Posterior Waist up skin examination, no atypical pigmented lesions or nonmelanoma skin cancer    All skin waist up examined.   Assessment & Plan    AK (actinic keratosis) (7) Mid Parietal Scalp (4); Right Buccal Cheek; Right Forehead; Left Temporal Scalp  Patient had a good reaction to PDT-still has roughly a dozen small actinic keratoses.  Will discuss repeating PDT this November  Encounter for screening for malignant neoplasm of skin Neck - Posterior  Annual skin examination.  Encouraged to self examine twice annually.  Continued ultraviolet protection.      I, Lavonna Monarch, MD, have reviewed all documentation for this visit.  The documentation on 07/11/20 for the exam, diagnosis, procedures, and orders are all accurate and complete.

## 2020-07-17 DIAGNOSIS — E782 Mixed hyperlipidemia: Secondary | ICD-10-CM | POA: Diagnosis not present

## 2020-07-17 DIAGNOSIS — M109 Gout, unspecified: Secondary | ICD-10-CM | POA: Diagnosis not present

## 2020-07-17 DIAGNOSIS — E559 Vitamin D deficiency, unspecified: Secondary | ICD-10-CM | POA: Diagnosis not present

## 2020-07-17 DIAGNOSIS — N183 Chronic kidney disease, stage 3 unspecified: Secondary | ICD-10-CM | POA: Diagnosis not present

## 2020-07-17 DIAGNOSIS — E042 Nontoxic multinodular goiter: Secondary | ICD-10-CM | POA: Diagnosis not present

## 2020-07-17 DIAGNOSIS — E119 Type 2 diabetes mellitus without complications: Secondary | ICD-10-CM | POA: Diagnosis not present

## 2020-07-17 DIAGNOSIS — E538 Deficiency of other specified B group vitamins: Secondary | ICD-10-CM | POA: Diagnosis not present

## 2020-07-17 DIAGNOSIS — Z8673 Personal history of transient ischemic attack (TIA), and cerebral infarction without residual deficits: Secondary | ICD-10-CM | POA: Diagnosis not present

## 2020-07-17 DIAGNOSIS — K5080 Crohn's disease of both small and large intestine without complications: Secondary | ICD-10-CM | POA: Diagnosis not present

## 2020-07-17 DIAGNOSIS — I25118 Atherosclerotic heart disease of native coronary artery with other forms of angina pectoris: Secondary | ICD-10-CM | POA: Diagnosis not present

## 2020-07-17 DIAGNOSIS — I10 Essential (primary) hypertension: Secondary | ICD-10-CM | POA: Diagnosis not present

## 2020-07-17 DIAGNOSIS — Z7984 Long term (current) use of oral hypoglycemic drugs: Secondary | ICD-10-CM | POA: Diagnosis not present

## 2020-07-25 DIAGNOSIS — E782 Mixed hyperlipidemia: Secondary | ICD-10-CM | POA: Diagnosis not present

## 2020-07-25 DIAGNOSIS — I10 Essential (primary) hypertension: Secondary | ICD-10-CM | POA: Diagnosis not present

## 2020-07-25 DIAGNOSIS — H35033 Hypertensive retinopathy, bilateral: Secondary | ICD-10-CM | POA: Diagnosis not present

## 2020-07-25 DIAGNOSIS — I25118 Atherosclerotic heart disease of native coronary artery with other forms of angina pectoris: Secondary | ICD-10-CM | POA: Diagnosis not present

## 2020-07-25 DIAGNOSIS — G8929 Other chronic pain: Secondary | ICD-10-CM | POA: Diagnosis not present

## 2020-07-25 DIAGNOSIS — N183 Chronic kidney disease, stage 3 unspecified: Secondary | ICD-10-CM | POA: Diagnosis not present

## 2020-07-25 DIAGNOSIS — E119 Type 2 diabetes mellitus without complications: Secondary | ICD-10-CM | POA: Diagnosis not present

## 2020-08-01 DIAGNOSIS — E119 Type 2 diabetes mellitus without complications: Secondary | ICD-10-CM | POA: Diagnosis not present

## 2020-08-21 DIAGNOSIS — E782 Mixed hyperlipidemia: Secondary | ICD-10-CM | POA: Diagnosis not present

## 2020-08-21 DIAGNOSIS — E119 Type 2 diabetes mellitus without complications: Secondary | ICD-10-CM | POA: Diagnosis not present

## 2020-08-21 DIAGNOSIS — I25118 Atherosclerotic heart disease of native coronary artery with other forms of angina pectoris: Secondary | ICD-10-CM | POA: Diagnosis not present

## 2020-08-21 DIAGNOSIS — H35033 Hypertensive retinopathy, bilateral: Secondary | ICD-10-CM | POA: Diagnosis not present

## 2020-08-21 DIAGNOSIS — I10 Essential (primary) hypertension: Secondary | ICD-10-CM | POA: Diagnosis not present

## 2020-08-21 DIAGNOSIS — G8929 Other chronic pain: Secondary | ICD-10-CM | POA: Diagnosis not present

## 2020-08-21 DIAGNOSIS — N183 Chronic kidney disease, stage 3 unspecified: Secondary | ICD-10-CM | POA: Diagnosis not present

## 2020-08-21 DIAGNOSIS — I2510 Atherosclerotic heart disease of native coronary artery without angina pectoris: Secondary | ICD-10-CM | POA: Diagnosis not present

## 2020-08-26 ENCOUNTER — Observation Stay (HOSPITAL_COMMUNITY)
Admission: EM | Admit: 2020-08-26 | Discharge: 2020-08-27 | Disposition: A | Discharge status: Home / Self Care | Payer: PPO | Attending: Interventional Cardiology | Admitting: Interventional Cardiology

## 2020-08-26 ENCOUNTER — Encounter (HOSPITAL_COMMUNITY): Payer: Self-pay

## 2020-08-26 ENCOUNTER — Telehealth: Payer: Self-pay | Admitting: Interventional Cardiology

## 2020-08-26 ENCOUNTER — Other Ambulatory Visit: Payer: Self-pay

## 2020-08-26 ENCOUNTER — Emergency Department (HOSPITAL_COMMUNITY): Payer: PPO

## 2020-08-26 DIAGNOSIS — R0789 Other chest pain: Secondary | ICD-10-CM | POA: Diagnosis not present

## 2020-08-26 DIAGNOSIS — Z7982 Long term (current) use of aspirin: Secondary | ICD-10-CM | POA: Diagnosis not present

## 2020-08-26 DIAGNOSIS — Z20822 Contact with and (suspected) exposure to covid-19: Secondary | ICD-10-CM | POA: Diagnosis not present

## 2020-08-26 DIAGNOSIS — Z9861 Coronary angioplasty status: Secondary | ICD-10-CM | POA: Insufficient documentation

## 2020-08-26 DIAGNOSIS — I200 Unstable angina: Secondary | ICD-10-CM | POA: Diagnosis not present

## 2020-08-26 DIAGNOSIS — Z8673 Personal history of transient ischemic attack (TIA), and cerebral infarction without residual deficits: Secondary | ICD-10-CM | POA: Insufficient documentation

## 2020-08-26 DIAGNOSIS — R079 Chest pain, unspecified: Secondary | ICD-10-CM | POA: Diagnosis not present

## 2020-08-26 DIAGNOSIS — I10 Essential (primary) hypertension: Secondary | ICD-10-CM | POA: Insufficient documentation

## 2020-08-26 DIAGNOSIS — Z79899 Other long term (current) drug therapy: Secondary | ICD-10-CM | POA: Insufficient documentation

## 2020-08-26 DIAGNOSIS — Z7984 Long term (current) use of oral hypoglycemic drugs: Secondary | ICD-10-CM | POA: Diagnosis not present

## 2020-08-26 DIAGNOSIS — Z85828 Personal history of other malignant neoplasm of skin: Secondary | ICD-10-CM | POA: Insufficient documentation

## 2020-08-26 DIAGNOSIS — Z87891 Personal history of nicotine dependence: Secondary | ICD-10-CM | POA: Diagnosis not present

## 2020-08-26 DIAGNOSIS — I25110 Atherosclerotic heart disease of native coronary artery with unstable angina pectoris: Principal | ICD-10-CM | POA: Insufficient documentation

## 2020-08-26 DIAGNOSIS — E119 Type 2 diabetes mellitus without complications: Secondary | ICD-10-CM | POA: Diagnosis not present

## 2020-08-26 LAB — BASIC METABOLIC PANEL
Anion gap: 9 (ref 5–15)
BUN: 22 mg/dL (ref 8–23)
CO2: 23 mmol/L (ref 22–32)
Calcium: 9.3 mg/dL (ref 8.9–10.3)
Chloride: 103 mmol/L (ref 98–111)
Creatinine, Ser: 1.26 mg/dL — ABNORMAL HIGH (ref 0.61–1.24)
GFR, Estimated: >60 mL/min (ref >60)
Glucose, Bld: 127 mg/dL — ABNORMAL HIGH (ref 70–99)
Potassium: 4.1 mmol/L (ref 3.5–5.1)
Sodium: 135 mmol/L (ref 135–145)

## 2020-08-26 LAB — CBC
HCT: 40.1 % (ref 39.0–52.0)
Hemoglobin: 13.4 g/dL (ref 13.0–17.0)
MCH: 30 pg (ref 26.0–34.0)
MCHC: 33.4 g/dL (ref 30.0–36.0)
MCV: 89.7 fL (ref 80.0–100.0)
Platelets: 242 10*3/uL (ref 150–400)
RBC: 4.47 MIL/uL (ref 4.22–5.81)
RDW: 13.3 % (ref 11.5–15.5)
WBC: 4.4 10*3/uL (ref 4.0–10.5)
nRBC: 0 % (ref 0.0–0.2)

## 2020-08-26 LAB — PROTIME-INR
INR: 1.1 (ref 0.8–1.2)
Prothrombin Time: 14.3 seconds (ref 11.4–15.2)

## 2020-08-26 LAB — TROPONIN I (HIGH SENSITIVITY)
Troponin I (High Sensitivity): 4 ng/L (ref <18)
Troponin I (High Sensitivity): 4 ng/L (ref <18)

## 2020-08-26 LAB — CBG MONITORING, ED
Glucose-Capillary: 111 mg/dL — ABNORMAL HIGH (ref 70–99)
Glucose-Capillary: 153 mg/dL — ABNORMAL HIGH (ref 70–99)

## 2020-08-26 LAB — HIV ANTIBODY (ROUTINE TESTING W REFLEX): HIV Screen 4th Generation wRfx: NONREACTIVE

## 2020-08-26 LAB — RESP PANEL BY RT-PCR (FLU A&B, COVID) ARPGX2
Influenza A by PCR: NEGATIVE
Influenza B by PCR: NEGATIVE
SARS Coronavirus 2 by RT PCR: NEGATIVE

## 2020-08-26 MED ORDER — NITROGLYCERIN 0.4 MG SL SUBL
0.4000 mg | SUBLINGUAL_TABLET | Freq: Once | SUBLINGUAL | Status: AC
  Administered 2020-08-26: 0.4 mg via SUBLINGUAL
  Filled 2020-08-26: qty 1

## 2020-08-26 MED ORDER — IRBESARTAN 300 MG PO TABS
300.0000 mg | ORAL_TABLET | Freq: Every day | ORAL | Status: DC
  Administered 2020-08-27: 300 mg via ORAL
  Filled 2020-08-26: qty 1

## 2020-08-26 MED ORDER — ASPIRIN 81 MG PO CHEW
81.0000 mg | CHEWABLE_TABLET | ORAL | Status: AC
  Administered 2020-08-27: 81 mg via ORAL
  Filled 2020-08-26: qty 1

## 2020-08-26 MED ORDER — SODIUM CHLORIDE 0.9 % WEIGHT BASED INFUSION
1.0000 mL/kg/h | INTRAVENOUS | Status: DC
  Administered 2020-08-27 (×2): 1 mL/kg/h via INTRAVENOUS

## 2020-08-26 MED ORDER — HEPARIN (PORCINE) 25000 UT/250ML-% IV SOLN
1500.0000 [IU]/h | INTRAVENOUS | Status: DC
  Administered 2020-08-26: 1300 [IU]/h via INTRAVENOUS
  Administered 2020-08-27: 1500 [IU]/h via INTRAVENOUS
  Filled 2020-08-26 (×2): qty 250

## 2020-08-26 MED ORDER — ASPIRIN EC 81 MG PO TBEC: 81.0000 mg | DELAYED_RELEASE_TABLET | ORAL | Status: DC

## 2020-08-26 MED ORDER — SODIUM CHLORIDE 0.9% FLUSH
3.0000 mL | Freq: Two times a day (BID) | INTRAVENOUS | Status: DC
  Administered 2020-08-27: 3 mL via INTRAVENOUS

## 2020-08-26 MED ORDER — SODIUM CHLORIDE 0.9 % IV SOLN: 250.0000 mL | INTRAVENOUS | Status: DC | PRN

## 2020-08-26 MED ORDER — HYDROCHLOROTHIAZIDE 12.5 MG PO CAPS
12.5000 mg | ORAL_CAPSULE | Freq: Every day | ORAL | Status: DC
  Administered 2020-08-27: 12.5 mg via ORAL
  Filled 2020-08-26: qty 1

## 2020-08-26 MED ORDER — VALSARTAN-HYDROCHLOROTHIAZIDE 320-12.5 MG PO TABS: 1.0000 | ORAL_TABLET | Freq: Every day | ORAL | Status: DC

## 2020-08-26 MED ORDER — SODIUM CHLORIDE 0.9% FLUSH: 3.0000 mL | INTRAVENOUS | Status: DC | PRN

## 2020-08-26 MED ORDER — ASPIRIN EC 81 MG PO TBEC
81.0000 mg | DELAYED_RELEASE_TABLET | Freq: Every day | ORAL | Status: DC
  Filled 2020-08-26: qty 1

## 2020-08-26 MED ORDER — MERCAPTOPURINE 50 MG PO TABS
50.0000 mg | ORAL_TABLET | Freq: Every day | ORAL | Status: DC
  Filled 2020-08-26: qty 1

## 2020-08-26 MED ORDER — MESALAMINE ER 0.375 G PO CP24: 1.5000 g | ORAL_CAPSULE | Freq: Every morning | ORAL | Status: DC

## 2020-08-26 MED ORDER — INSULIN ASPART 100 UNIT/ML IJ SOLN
0.0000 [IU] | Freq: Three times a day (TID) | INTRAMUSCULAR | Status: DC
  Administered 2020-08-27: 1 [IU] via SUBCUTANEOUS

## 2020-08-26 MED ORDER — FENOFIBRATE 160 MG PO TABS
160.0000 mg | ORAL_TABLET | Freq: Every day | ORAL | Status: DC
  Administered 2020-08-27: 160 mg via ORAL
  Filled 2020-08-26: qty 1

## 2020-08-26 MED ORDER — ROSUVASTATIN CALCIUM 20 MG PO TABS: 20.0000 mg | ORAL_TABLET | Freq: Every day | ORAL | Status: DC

## 2020-08-26 MED ORDER — SODIUM CHLORIDE 0.9 % WEIGHT BASED INFUSION: 3.0000 mL/kg/h | INTRAVENOUS | Status: AC

## 2020-08-26 MED ORDER — ACETAMINOPHEN 325 MG PO TABS: 650.0000 mg | ORAL_TABLET | ORAL | Status: DC | PRN

## 2020-08-26 MED ORDER — ONDANSETRON HCL 4 MG/2ML IJ SOLN: 4.0000 mg | Freq: Four times a day (QID) | INTRAMUSCULAR | Status: DC | PRN

## 2020-08-26 MED ORDER — HEPARIN BOLUS VIA INFUSION
4000.0000 [IU] | Freq: Once | INTRAVENOUS | Status: AC
  Administered 2020-08-26: 4000 [IU] via INTRAVENOUS
  Filled 2020-08-26: qty 4000

## 2020-08-26 MED ORDER — ROSUVASTATIN CALCIUM 20 MG PO TABS
20.0000 mg | ORAL_TABLET | Freq: Every day | ORAL | Status: DC
  Administered 2020-08-27: 20 mg via ORAL
  Filled 2020-08-26: qty 1

## 2020-08-26 MED ORDER — NITROGLYCERIN 0.4 MG SL SUBL: 0.4000 mg | SUBLINGUAL_TABLET | SUBLINGUAL | Status: DC | PRN

## 2020-08-26 NOTE — ED Provider Notes (Signed)
Emergency Medicine Provider Triage Evaluation Note  Jesus Stone , a 65 y.o. male  was evaluated in triage.  Pt complains of chest pain.  Began yesterday.  It radiates to his left neck and left shoulder.  Heaviness in his R arm.  Symptoms are worse with exertion.  No associated shortness of breath or diaphoresis.  History of heart attack, states this feels similar.  Review of Systems  Positive: cp Negative: N/v  Physical Exam  BP (!) 151/85   Pulse (!) 58   Temp 98 F (36.7 C)   Resp 17   SpO2 100%  Gen:   Awake, no distress   Resp:  Normal effort  MSK:   Moves extremities without difficulty    Medical Decision Making  Medically screening exam initiated at 10:45 AM.  Appropriate orders placed.  VERNIE KOSAK was informed that the remainder of the evaluation will be completed by another provider, this initial triage assessment does not replace that evaluation, and the importance of remaining in the ED until their evaluation is complete.  Cp w/u started   Franchot Heidelberg, PA-C 08/26/20 1046    Daleen Bo, MD 08/27/20 1743

## 2020-08-26 NOTE — Telephone Encounter (Signed)
Pt c/o of Chest Pain: STAT if CP now or developed within 24 hours  1. Are you having CP right now? Yes   2. Are you experiencing any other symptoms (ex. SOB, nausea, vomiting, sweating)? Stiffness in neck  3. How long have you been experiencing CP? Since Saturday  4. Is your CP continuous or coming and going? continuous  5. Have you taken Nitroglycerin? Yes; expired in 2017; took 10 full strength tylenol last night to stop pain; pain come back this morning  159/86; 59 (left arm) 148/85; 56 (right arm) ?

## 2020-08-26 NOTE — Progress Notes (Signed)
ANTICOAGULATION CONSULT NOTE - Initial Consult  Pharmacy Consult for Heparin Indication: chest pain/ACS  Allergies  Allergen Reactions   Lipitor [Atorvastatin] Other (See Comments)    Memory loss   Simvastatin     Memory loss    Patient Measurements: Height: '5\' 11"'$  (180.3 cm) Weight: 98.9 kg (218 lb) IBW/kg (Calculated) : 75.3 Heparin Dosing Weight: 95.6 kg  Vital Signs: Temp: 98 F (36.7 C) (07/25 0953) BP: 134/76 (07/25 1615) Pulse Rate: 55 (07/25 1615)  Labs: Recent Labs    08/26/20 1037 08/26/20 1353  HGB 13.4  --   HCT 40.1  --   PLT 242  --   CREATININE 1.26*  --   TROPONINIHS 4 4    Estimated Creatinine Clearance: 68.2 mL/min (A) (by C-G formula based on SCr of 1.26 mg/dL (H)).   Medical History: Past Medical History:  Diagnosis Date   Acute sinusitis, unspecified    Anal polyp    Bell's palsy    CAD (coronary artery disease)    a. BMSx2 to ramus 2000  b. 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   Crohn's disease ([place])    Diabetes mellitus, type 2 ([facility])    Gout, unspecified    HLD (hyperlipidemia)    HTN (hypertension)    Nontoxic multinodular goiter    Obesity    Periodic limb movement disorder    Sleep apnea    Squamous cell carcinoma of skin 04/04/2014   well diff-right crown (CX35FU)    Squamous cell carcinoma of skin 11/08/2018   left medial scalp (MOHS)   Squamous cell carcinoma of skin 11/08/2018   KA-left preauricular- [place]   Thyroid nodule    TIA (transient ischemic attack)    Unspecified vitamin D deficiency     Medications:  (Not in a hospital admission)  Scheduled:   [START ON 08/28/2020] aspirin EC  81 mg Oral Q M,W,F   [START ON 08/27/2020] aspirin EC  81 mg Oral Daily   fenofibrate  160 mg Oral Daily   mercaptopurine  50 mg Oral Daily   [START ON 08/27/2020] mesalamine  1.5 g Oral q AM   rosuvastatin  20 mg Oral Daily   [START ON 08/27/2020] valsartan-hydrochlorothiazide  1  tablet Oral Daily   Infusions:   Assessment: 16 yom with a history of CAD s/p BMS x 2 and DES, HTN, HLD, DM2, OSA, TIA, and obesity that presented to ED with chest pain. Pharmacy has been consulted for heparin drip.  Patient is not on anticoagulation at home prior to arrival. CBC is stable. Plt 242.   Goal of Therapy:  Heparin level 0.3-0.7 units/ml Monitor platelets by anticoagulation protocol: Yes   Plan:  Give 4000 units bolus x 1 Start heparin infusion at 1300 units/hr Check anti-Xa level in 6-8 hours and daily while on heparin Continue to monitor H&H and platelets  Lorelei Pont, PharmD, BCPS 08/26/2020 4:27 PM ED Clinical Pharmacist -  [phone]

## 2020-08-26 NOTE — Telephone Encounter (Signed)
Pt called to report that he has started having chest pressure radiating to his left shoulder and jaw this past Saturday. He says it was constant and he had some old nitro that had expired that did not relieve the pain after using 2 of them.   Sunday he was still having the pain/ pressure so he then took 10 tabs of ASA 325 mg... he says the pain eased off and he slept well last night. After waking up this morning, he developed the pressure again and call our office.   I have advised the pt that with his history, cath and intervention in 2017, he needs to go the ED but I urged him not to drive himself but he says he felt comfortable driving but I strongly urged him not to... he had coffee this morning so I asked him to hold off on any further food until he is able to make it to the ED and be assessed in case they would like to do any testing today.

## 2020-08-26 NOTE — ED Provider Notes (Signed)
[facility] EMERGENCY DEPARTMENT Provider Note   CSN: [id number] Arrival date & time: 08/26/20  [id number]     History No chief complaint on file.   Jesus Stone is a 65 y.o. male presenting for evaluation of chest pain.  Patient states his chest pain began 2 evenings ago.  Was gradual onset.  Resolved that evening.  The next morning his pain came on suddenly, 5-6 out of 10.  Pain is worse with exertion.  Was constant and lasted all day.  He took aspirin and pain improved.  This morning, pain returned.  He has not taken anything today.  Pain radiates to his neck and his left arm feels heavy.  He has intermittent palpitations.  Pain is worse with exertion.  At rest, he continues to have some mild discomfort.  No associated shortness of breath, headache, dizziness, lightheadedness, nausea, vomiting, diaphoresis.  He has a history of cardiac disease with previous stenting, most recently 5 years ago.  He follows [facility].  Additional history obtained from chart review.  History of hyperlipidemia, hypertension, TIA, diabetes, obesity, CAD with drug-eluting stent. Cath in 2017 showed "Mid RCA lesion, 20% stenosed. Prox LAD lesion, 20% stenosed. The left ventricular systolic function is normal. Ost Ramus to Ramus lesion, 80% stenosed. Post intervention, there is a 0% residual stenosis. The lesion was previously treated with a bare metal stent greater than two years ago. IMPRESSION: 1. Single vessel obstructive CAD with late restenosis of the ramus intermediate branch. 2. Normal LV function 3. Successful stenting of the ramus intermediate with a DES."    HPI  HPI: A 65 year old patient with a history of treated diabetes, hypertension, hypercholesterolemia and obesity presents for evaluation of chest pain. Initial onset of pain was more than 6 hours ago. The patient's chest pain is described as heaviness/pressure/tightness and is not worse with exertion. The patient's chest  pain is middle- or left-sided, is not well-localized, is not sharp and does not radiate to the arms/jaw/neck. The patient does not complain of nausea and denies diaphoresis. The patient has no history of stroke, has no history of peripheral artery disease, has not smoked in the past 90 days and has no relevant family history of coronary artery disease (first degree relative at less than age 77).   Past Medical History:  Diagnosis Date   Acute sinusitis, unspecified    Anal polyp    Bell's palsy    CAD (coronary artery disease)    a. BMSx2 to ramus 2000  b. 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   Crohn's disease ([place])    Diabetes mellitus, type 2 (HCC)    Gout, unspecified    HLD (hyperlipidemia)    HTN (hypertension)    Nontoxic multinodular goiter    Obesity    Periodic limb movement disorder    Sleep apnea    Squamous cell carcinoma of skin 04/04/2014   well diff-right crown (CX35FU)    Squamous cell carcinoma of skin 11/08/2018   left medial scalp ([facility])   Squamous cell carcinoma of skin 11/08/2018   KA-left preauricular- [place]   Thyroid nodule    TIA (transient ischemic attack)    Unspecified vitamin D deficiency     Patient Active Problem List   Diagnosis Date Noted   Unstable angina ([place]) 08/26/2020   Sleep apnea    Crohn's disease ([place])    HTN (hypertension)    TIA (  transient ischemic attack)    Diabetes mellitus, type 2 ([place])    Obesity    Coronary artery disease involving native coronary artery of native heart with angina pectoris ([place])    HLD (hyperlipidemia) 04/25/2015    Past Surgical History:  Procedure Laterality Date   APPENDECTOMY  1964   CARDIAC CATHETERIZATION N/A 04/26/2015   Procedure: Left Heart Cath and Coronary Angiography;  Surgeon: Peter M Martinique, MD;  Location: [place] [facility];  Service: Cardiovascular;  Laterality: N/A;   CARDIAC CATHETERIZATION N/A 04/26/2015   Procedure: Intravascular  Pressure Wire/FFR Study;  Surgeon: Peter M Martinique, MD;  Location: [place] [facility];  Service: Cardiovascular;  Laterality: N/A;   CARDIAC CATHETERIZATION N/A 04/26/2015   Procedure: Coronary Stent Intervention;  Surgeon: Peter M Martinique, MD;  Location: [place] [facility];  Service: Cardiovascular;  Laterality: N/A;   Coronary artery stent placment  03/1998   HEEL SPUR EXCISION Right 2008   RECTAL POLYPECTOMY  2008, 2009   [place]       Family History  Problem Relation Age of Onset   Cancer Mother    Diabetes Sister    Cancer Maternal Grandmother    Cancer Paternal Aunt     Social History   Tobacco Use   Smoking status: Former    Types: Cigarettes    Quit date: 03/01/1984    Years since quitting: 36.5   Smokeless tobacco: Never  Substance Use Topics   Alcohol use: No    Alcohol/week: 0.0 standard drinks   Drug use: No    Home Medications Prior to Admission medications   Medication Sig Start Date End Date Taking? Authorizing Provider  APRISO 0.375 g 24 hr capsule Take 1,500 mg by mouth in the morning. 04/13/15  Yes [provider]  aspirin EC 81 MG tablet Take 1 tablet (81 mg total) by mouth every Monday, Wednesday, and Friday. Swallow whole. Patient taking differently: Take 81 mg by mouth daily. Swallow whole. 01/03/20  Yes Belva Crome, MD  Cholecalciferol (VITAMIN D) 50 MCG (2000 UT) CAPS Take 1 capsule by mouth daily.   Yes [provider]  fenofibrate (TRICOR) 145 MG tablet Take 145 mg by mouth daily. 08/17/20  Yes [provider]  mercaptopurine (PURINETHOL) 50 MG tablet Take 1 tablet by mouth daily.  01/22/13  Yes [provider]  metFORMIN (GLUCOPHAGE) 500 MG tablet Take 500-1,000 mg by mouth See admin instructions. Take 1 tablet (500 mg) in the morning and Take 2 tablets (1000 mg) in the evening 04/13/15  Yes [provider]  nitroGLYCERIN (NITROSTAT) 0.4 MG SL tablet Place 1 tablet (0.4 mg total) under the  tongue every 5 (five) minutes as needed for chest pain (x 3 doses). 07/17/15  Yes Belva Crome, MD  Omega-3 1000 MG CAPS Take 1 capsule by mouth 2 (two) times daily.    Yes [provider]  rosuvastatin (CRESTOR) 20 MG tablet TAKE ONE TABLET BY MOUTH ONE TIME DAILY 02/13/20  Yes Belva Crome, MD  valsartan-hydrochlorothiazide (DIOVAN-HCT) 320-12.5 MG tablet TAKE ONE TABLET BY MOUTH ONE TIME DAILY 02/14/20  Yes Belva Crome, MD  Zinc 50 MG TABS Take 1 tablet by mouth 3 (three) times a week. MWF   Yes [provider]    Allergies    Lipitor [atorvastatin] and Simvastatin  Review of Systems   Review of Systems  Cardiovascular:  Positive for chest pain.  All other systems reviewed and are  negative.  Physical Exam Updated Vital Signs BP 134/76   Pulse (!) 55   Temp 98 F (36.7 C)   Resp 10   Ht '5\' 11"'$  (1.803 m)   Wt 98.9 kg   SpO2 99%   BMI 30.40 kg/m   Physical Exam Vitals and nursing note reviewed.  Constitutional:      General: He is not in acute distress.    Appearance: Normal appearance.     Comments: nontoxic  HENT:     Head: Normocephalic and atraumatic.  Eyes:     Conjunctiva/sclera: Conjunctivae normal.     Pupils: Pupils are equal, round, and reactive to light.  Cardiovascular:     Rate and Rhythm: Normal rate and regular rhythm.     Pulses: Normal pulses.  Pulmonary:     Effort: Pulmonary effort is normal. No respiratory distress.     Breath sounds: Normal breath sounds. No wheezing.     Comments: Speaking in full sentences.  Clear lung sounds in all fields. Abdominal:     General: There is no distension.     Palpations: Abdomen is soft. There is no mass.     Tenderness: There is no abdominal tenderness. There is no rebound.  Musculoskeletal:        General: Normal range of motion.     Cervical back: Normal range of motion and neck supple.     Right lower leg: No edema.     Left lower leg: No edema.  Skin:    General: Skin is warm and  dry.     Capillary Refill: Capillary refill takes less than 2 seconds.  Neurological:     Mental Status: He is alert and oriented to person, place, and time.  Psychiatric:        Mood and Affect: Mood and affect normal.        Speech: Speech normal.        Behavior: Behavior normal.    ED Results / Procedures / Treatments   Labs (all labs ordered are listed, but only abnormal results are displayed) Labs Reviewed  BASIC METABOLIC PANEL - Abnormal; Notable for the following components:      Result Value   Glucose, Bld 127 (*)    Creatinine, Ser 1.26 (*)    All other components within normal limits  RESP PANEL BY RT-PCR (FLU A&B, COVID) ARPGX2  CBC  HIV ANTIBODY (ROUTINE TESTING W REFLEX)  TROPONIN I (HIGH SENSITIVITY)  TROPONIN I (HIGH SENSITIVITY)    EKG EKG Interpretation  Date/Time:  Monday August 26 2020 10:25:49 EDT Ventricular Rate:  63 PR Interval:  174 QRS Duration: 102 QT Interval:  386 QTC Calculation: 395 R Axis:   69 Text Interpretation: Normal sinus rhythm Normal ECG Confirmed by Pattricia Boss [phone]) on 08/26/2020 11:21:22 AM  Radiology DG Chest 2 View  Result Date: 08/26/2020 CLINICAL DATA:  Chest pain EXAM: CHEST - 2 VIEW COMPARISON:  04/25/2015 FINDINGS: The heart size and mediastinal contours are within normal limits. Both lungs are clear. The visualized skeletal structures are unremarkable. IMPRESSION: No active cardiopulmonary disease. Electronically Signed   By: Kerby Moors M.D.   On: 08/26/2020 11:27    Procedures Procedures   Medications Ordered in ED Medications  aspirin EC tablet 81 mg (has no administration in time range)  mercaptopurine (PURINETHOL) tablet 50 mg (has no administration in time range)  fenofibrate tablet 160 mg (has no administration in time range)  rosuvastatin (CRESTOR) tablet 20 mg (has no  administration in time range)  valsartan-hydrochlorothiazide (DIOVAN-HCT) 320-12.5 MG per tablet 1 tablet (has no administration in  time range)  mesalamine (APRISO) 24 hr capsule 1.5 g (has no administration in time range)  aspirin EC tablet 81 mg (has no administration in time range)  nitroGLYCERIN (NITROSTAT) SL tablet 0.4 mg (has no administration in time range)  acetaminophen (TYLENOL) tablet 650 mg (has no administration in time range)  ondansetron (ZOFRAN) injection 4 mg (has no administration in time range)  nitroGLYCERIN (NITROSTAT) SL tablet 0.4 mg (0.4 mg Sublingual Given 08/26/20 1350)    ED Course  I have reviewed the triage vital signs and the nursing notes.  Pertinent labs & imaging results that were available during my care of the patient were reviewed by me and considered in my medical decision making (see chart for details).    MDM Rules/Calculators/A&P HEAR Score: 5                         Patient running for evaluation of chest pain.  Some concerning features including radiation and worsening pain with exertion.  Patient still has pain currently, but it is improved.  However it is atypical in duration/intermittent nature, without shortness of breath or diaphoresis.  Initial labs obtained from triage interpreted by me, overall reassuring.  Troponin negative.  EKG is normal sinus rhythm.  Chest x-ray viewed and independently interpreted by me, no pneumonia pneumothorax or effusion.  Will consult cardiology.   Cardiology evaluated the pt, will admit.   Final Clinical Impression(s) / ED Diagnoses Final diagnoses:  Chest pain, unspecified type    Rx / DC Orders ED Discharge Orders     None        Franchot Heidelberg, PA-C 08/26/20 1629    Daleen Bo, MD 08/27/20 1743

## 2020-08-26 NOTE — H&P (Addendum)
The patient has been seen in conjunction with Fabian Sharp, PAC. All aspects of care have been considered and discussed. The patient has been personally interviewed, examined, and all clinical data has been reviewed.  The story has atypical features, predominantly duration with normal ECG and markers. Concerning is h/o prior stent, ISR, and current symptoms similar to prior angina. Therefore, need coronary angio to define anatomy. Discussed with the patient.  The patient was counseled to undergo left heart catheterization, coronary angiography, and possible percutaneous coronary intervention with stent implantation. The procedural risks and benefits were discussed in detail. The risks discussed included death, stroke, myocardial infarction, life-threatening bleeding, limb ischemia, kidney injury, allergy, and possible emergency cardiac surgery. The risk of these significant complications were estimated to occur less than 1% of the time. After discussion, the patient has agreed to proceed. He understands procedure may not be possible until tomorrow.     Cardiology Admission History and Physical:   Patient ID: Jesus Stone MRN: [id number]; DOB: 01-21-1956   Admission date: 08/26/2020  PCP:  Josetta Huddle, MD   [facility] Providers Cardiologist:  Sinclair Grooms, MD    Chief Complaint:  chest pain  Patient Profile:   Jesus Stone is a 65 y.o. male with a hx of CAD s/p BMS x 2 to ramus in 2000 and DES in 2017 for ISR of ramus, HTN, HLD, DM2, OSA, TIA, and obesity who is being seen 08/26/2020 for the evaluation of chest pain.  History of Present Illness:   Mr. Jesus Stone with the above history was last seen by Dr. Tamala Julian in clinic 01/02/20 and had self-discontinued ASA due to concern for intestinal/abdominal bloating. He presented to [facility] for chest pain that started 2 days ago on Sat 08/24/20. He called our office and was instructed to proceed to ED.   During my interview, he  reports attending his 76 yo granddaughter's birthday party on Saturday in the heat. He admits to being dehydrated. CP started at approximately 2pm, located in his left chest and radiating to his left neck and shoulder. No SOB, palpitations, or nausea. CP was rated as a 5/10. CP was constant and not changed with position. Chest pain persisted throughout the evening but he was able to sleep that evening. He woke up Sunday with the CP that persisted throughout the day. CP was not changed by activity or rest, although he recounts being active all day. When he sat down and rested last evening, the chest pain worsened and he took 10 ASA 325 mg and went to sleep. We was able to sleep. When he woke up, he did not have chest pain, but CP recurred as he started moving around. He took nitro x 2, but the bottle was 67 years old, no tingling under tongue. He told his wife and called our office who advised ER visit.   He has not had a recurrence of chest pain since being in the ER. He was able to climb a flight of stairs from the parking garage to the ER without recurrence of chest pain. He is still chest pain free.   HS troponin was 4, delta pending EKG nonischemic No tachycardia, dyspnea, LE swelling, or recent travel, lower suspicion for PE.    Past Medical History:  Diagnosis Date   Acute sinusitis, unspecified    Anal polyp    Bell's palsy    CAD (coronary artery disease)    a. BMSx2 to ramus 2000  b. 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   Crohn's disease (HCC)    Diabetes mellitus, type 2 (HCC)    Gout, unspecified    HLD (hyperlipidemia)    HTN (hypertension)    Nontoxic multinodular goiter    Obesity    Periodic limb movement disorder    Sleep apnea    Squamous cell carcinoma of skin 04/04/2014   well diff-right crown ([id number])    Squamous cell carcinoma of skin 11/08/2018   left medial scalp (MOHS)   Squamous cell carcinoma of skin 11/08/2018    KA-left preauricular- [place]   Thyroid nodule    TIA (transient ischemic attack)    Unspecified vitamin D deficiency     Past Surgical History:  Procedure Laterality Date   APPENDECTOMY  1964   CARDIAC CATHETERIZATION N/A 04/26/2015   Procedure: Left Heart Cath and Coronary Angiography;  Surgeon: Peter M Martinique, MD;  Location: [place] [facility];  Service: Cardiovascular;  Laterality: N/A;   CARDIAC CATHETERIZATION N/A 04/26/2015   Procedure: Intravascular Pressure Wire/FFR Study;  Surgeon: Peter M Martinique, MD;  Location: [place] [facility];  Service: Cardiovascular;  Laterality: N/A;   CARDIAC CATHETERIZATION N/A 04/26/2015   Procedure: Coronary Stent Intervention;  Surgeon: Peter M Martinique, MD;  Location: [place] [facility];  Service: Cardiovascular;  Laterality: N/A;   Coronary artery stent placment  03/1998   HEEL SPUR EXCISION Right 2008   RECTAL POLYPECTOMY  2008, 2009   [place]     Medications Prior to Admission: Prior to Admission medications   Medication Sig Start Date End Date Taking? Authorizing Provider  APRISO 0.375 g 24 hr capsule Take 1,500 mg by mouth in the morning. 04/13/15  Yes [provider]  aspirin EC 81 MG tablet Take 1 tablet (81 mg total) by mouth every Monday, Wednesday, and Friday. Swallow whole. Patient taking differently: Take 81 mg by mouth daily. Swallow whole. 01/03/20  Yes Belva Crome, MD  Cholecalciferol (VITAMIN D) 50 MCG (2000 UT) CAPS Take 1 capsule by mouth daily.   Yes [provider]  fenofibrate (TRICOR) 145 MG tablet Take 145 mg by mouth daily. 08/17/20  Yes [provider]  mercaptopurine (PURINETHOL) 50 MG tablet Take 1 tablet by mouth daily.  01/22/13  Yes [provider]  metFORMIN (GLUCOPHAGE) 500 MG tablet Take 500-1,000 mg by mouth See admin instructions. Take 1 tablet (500 mg) in the morning and Take 2 tablets (1000 mg) in the evening 04/13/15  Yes [provider]  nitroGLYCERIN  (NITROSTAT) 0.4 MG SL tablet Place 1 tablet (0.4 mg total) under the tongue every 5 (five) minutes as needed for chest pain (x 3 doses). 07/17/15  Yes Belva Crome, MD  Omega-3 1000 MG CAPS Take 1 capsule by mouth 2 (two) times daily.    Yes [provider]  rosuvastatin (CRESTOR) 20 MG tablet TAKE ONE TABLET BY MOUTH ONE TIME DAILY 02/13/20  Yes Belva Crome, MD  valsartan-hydrochlorothiazide (DIOVAN-HCT) 320-12.5 MG tablet TAKE ONE TABLET BY MOUTH ONE TIME DAILY 02/14/20  Yes Belva Crome, MD  Zinc 50 MG TABS Take 1 tablet by mouth 3 (three) times a week. MWF   Yes [provider]     Allergies:    Allergies  Allergen Reactions   Lipitor [Atorvastatin] Other (See Comments)    Memory loss   Simvastatin     Memory loss  Social History:   Social History   Socioeconomic History   Marital status: Married    Spouse name: sharon   Number of children: 2   Years of education: college   Highest education level: Not on file  Occupational History   Occupation: [employer]    Employer: [employer]  Tobacco Use   Smoking status: Former    Types: Cigarettes    Quit date: 03/01/1984    Years since quitting: 36.5   Smokeless tobacco: Never  Substance and Sexual Activity   Alcohol use: No    Alcohol/week: 0.0 standard drinks   Drug use: No   Sexual activity: Yes  Other Topics Concern   Not on file  Social History Narrative   Right handed male   Social Determinants of Health   Financial Resource Strain: Not on file  Food Insecurity: Not on file  Transportation Needs: Not on file  Physical Activity: Not on file  Stress: Not on file  Social Connections: Not on file  Intimate Partner Violence: Not on file    Family History:   The patient's family history includes Cancer in his maternal grandmother, mother, and paternal aunt; Diabetes in his sister.    ROS:  Please see the history of present illness.  All other ROS reviewed and negative.     Physical  Exam/Data:   Vitals:   08/26/20 1400 08/26/20 1415 08/26/20 1445 08/26/20 1545  BP: 133/75 117/70 114/66 127/78  Pulse: (!) 59 (!) 58 (!) 56 62  Resp: '13 15 11 16  '$ Temp:      SpO2: 97% 97% 98% 100%  Weight:      Height:       No intake or output data in the 24 hours ending 08/26/20 1554 Last 3 Weights 08/26/2020 01/02/2020 12/07/2018  Weight (lbs) 218 lb 219 lb 228 lb  Weight (kg) 98.884 kg 99.338 kg 103.42 kg     Body mass index is 30.4 kg/m.  General:  Well nourished, well developed, in no acute distress HEENT: normal Lymph: no adenopathy Neck: no JVD Endocrine:  No thryomegaly Vascular: No carotid bruits; FA pulses 2+ bilaterally without bruits  Cardiac:  normal S1, S2; RRR; no murmur  Lungs:  clear to auscultation bilaterally, no wheezing, rhonchi or rales  Abd: soft, nontender, no hepatomegaly  Ext: no edema Musculoskeletal:  No deformities, BUE and BLE strength normal and equal Skin: warm and dry  Neuro:  CNs 2-12 intact, no focal abnormalities noted Psych:  Normal affect    EKG:  The ECG that was done was personally reviewed and demonstrates NSR HR 63  Relevant CV Studies:  Left heart cath 04/26/15: Mid RCA lesion, 20% stenosed. Prox LAD lesion, 20% stenosed. The left ventricular systolic function is normal. Ost Ramus to Ramus lesion, 80% stenosed. Post intervention, there is a 0% residual stenosis. The lesion was previously treated with a bare metal stent greater than two years ago.   1. Single vessel obstructive CAD with late restenosis of the ramus intermediate branch. 2. Normal LV function 3. Successful stenting of the ramus intermediate with a DES.   Plan: DAPT for one year. Anticipate DC in am.  Laboratory Data:  High Sensitivity Troponin:   Recent Labs  Lab 08/26/20 1037 08/26/20 1353  TROPONINIHS 4 4      Chemistry Recent Labs  Lab 08/26/20 1037  NA 135  K 4.1  CL 103  CO2 23  GLUCOSE 127*  BUN 22  CREATININE 1.26*  CALCIUM 9.3   GFRNONAA >60  ANIONGAP 9    No results for input(s): PROT, ALBUMIN, AST, ALT, ALKPHOS, BILITOT in the last 168 hours. Hematology Recent Labs  Lab 08/26/20 1037  WBC 4.4  RBC 4.47  HGB 13.4  HCT 40.1  MCV 89.7  MCH 30.0  MCHC 33.4  RDW 13.3  PLT 242   BNPNo results for input(s): BNP, PROBNP in the last 168 hours.  DDimer No results for input(s): DDIMER in the last 168 hours.   Radiology/Studies:  DG Chest 2 View  Result Date: 08/26/2020 CLINICAL DATA:  Chest pain EXAM: CHEST - 2 VIEW COMPARISON:  04/25/2015 FINDINGS: The heart size and mediastinal contours are within normal limits. Both lungs are clear. The visualized skeletal structures are unremarkable. IMPRESSION: No active cardiopulmonary disease. Electronically Signed   By: Kerby Moors M.D.   On: 08/26/2020 11:27     Assessment and Plan:   Chest pain CAD s/p BMS in 2000 and DES in 2017 for ISR of ramus - chest pain started 08/24/20 - hs troponin negative, delta pending - EKG does not appear ischemic - continue ASA, no BB for bradycardia - chest pain description somewhat atypical, but is reminiscent of his prior PCI episodes - given his known disease, would opt for ischemic evaluation - will admit to cardiology service with plans for heart cath tomorrow - start heparin gtt per pharmacy - hold BB for bradycardia   Hypertension - maintained on valsartan-HCTZ 320-12.5 mg - mildly elevated now, continue to follow   Hyperlipidemia with LDL goal < 70 - continue tricor and 20 mg crestor, omega 3 - no recent lipid panel in epic, will collect in the AM   DM - maintained on metformin - per PCP, no recent A1c in epic - SSI    Risk Assessment/Risk Scores:    HEAR Score (for undifferentiated chest pain):  HEAR Score: 5{    Severity of Illness: The appropriate patient status for this patient is OBSERVATION. Observation status is judged to be reasonable and necessary in order to provide the required intensity  of service to ensure the patient's safety. The patient's presenting symptoms, physical exam findings, and initial radiographic and laboratory data in the context of their medical condition is felt to place them at decreased risk for further clinical deterioration. Furthermore, it is anticipated that the patient will be medically stable for discharge from the hospital within 2 midnights of admission. The following factors support the patient status of observation.   " The patient's presenting symptoms include unstable angina. " The physical exam findings include RRR " The initial radiographic and laboratory data are hs troponin negative.   For questions or updates, please contact [place] Please consult [email or web address] for contact info under     Signed, Ledora Bottcher, [place]  08/26/2020 3:54 PM

## 2020-08-26 NOTE — ED Provider Notes (Signed)
  Face-to-face evaluation   History: He presents for evaluation of intermittent chest pain, for 3 days, more on than off.  The pain is heavy and left upper chest location.  No known provocations.  No associated diaphoresis, shortness of breath, weakness or dizziness.  He took some nitroglycerin yesterday but it was 67 years old he did not feel any effect from it.  Yesterday evening he took 10 full-strength, 325 mg aspirin tablets, while he was having the pain.  After that the pain seemed to go away and he was able to sleep well during the night.  Pain started after he awoke this morning and has been persistent until he arrived in the ER, at which time it almost completely abated.  He took most of his usual medicines but did not take the rosuvastatin, because he has been having some muscle pain recently.  This morning he took a 81 mg aspirin tablet which he usually takes every day.  Physical exam: Alert, calm, cooperative.  Chest nontender to palpation.  No dysarthria or aphasia.  No respiratory distress.  Medical screening examination/treatment/procedure(s) were conducted as a shared visit with non-physician practitioner(s) and myself.  I personally evaluated the patient during the encounter    Daleen Bo, MD 08/27/20 1743

## 2020-08-26 NOTE — ED Triage Notes (Signed)
Patient complains of chest pain with left shoulder pain since saturday. Patient took old SL ntg with no relief. Took 10 '324mg'$  aspirin last night and states that the pain resolved. This am the pain returned.

## 2020-08-27 ENCOUNTER — Encounter (HOSPITAL_COMMUNITY)
Admission: EM | Disposition: A | Discharge status: Home / Self Care | Payer: Self-pay | Source: Home / Self Care | Attending: Emergency Medicine

## 2020-08-27 DIAGNOSIS — R079 Chest pain, unspecified: Secondary | ICD-10-CM | POA: Diagnosis not present

## 2020-08-27 DIAGNOSIS — I25118 Atherosclerotic heart disease of native coronary artery with other forms of angina pectoris: Secondary | ICD-10-CM | POA: Diagnosis not present

## 2020-08-27 DIAGNOSIS — I200 Unstable angina: Secondary | ICD-10-CM

## 2020-08-27 HISTORY — PX: LEFT HEART CATH AND CORONARY ANGIOGRAPHY: CATH118249

## 2020-08-27 LAB — CBC
HCT: 38.8 % — ABNORMAL LOW (ref 39.0–52.0)
Hemoglobin: 12.7 g/dL — ABNORMAL LOW (ref 13.0–17.0)
MCH: 29.9 pg (ref 26.0–34.0)
MCHC: 32.7 g/dL (ref 30.0–36.0)
MCV: 91.3 fL (ref 80.0–100.0)
Platelets: 219 10*3/uL (ref 150–400)
RBC: 4.25 MIL/uL (ref 4.22–5.81)
RDW: 13.4 % (ref 11.5–15.5)
WBC: 5.3 10*3/uL (ref 4.0–10.5)
nRBC: 0 % (ref 0.0–0.2)

## 2020-08-27 LAB — BASIC METABOLIC PANEL
Anion gap: 10 (ref 5–15)
BUN: 18 mg/dL (ref 8–23)
CO2: 24 mmol/L (ref 22–32)
Calcium: 9 mg/dL (ref 8.9–10.3)
Chloride: 103 mmol/L (ref 98–111)
Creatinine, Ser: 1.07 mg/dL (ref 0.61–1.24)
GFR, Estimated: >60 mL/min (ref >60)
Glucose, Bld: 115 mg/dL — ABNORMAL HIGH (ref 70–99)
Potassium: 3.9 mmol/L (ref 3.5–5.1)
Sodium: 137 mmol/L (ref 135–145)

## 2020-08-27 LAB — HEPATIC FUNCTION PANEL
ALT: 22 U/L (ref 0–44)
AST: 24 U/L (ref 15–41)
Albumin: 3.4 g/dL — ABNORMAL LOW (ref 3.5–5.0)
Alkaline Phosphatase: 40 U/L (ref 38–126)
Bilirubin, Direct: 0.1 mg/dL (ref 0.0–0.2)
Indirect Bilirubin: 0.5 mg/dL (ref 0.3–0.9)
Total Bilirubin: 0.6 mg/dL (ref 0.3–1.2)
Total Protein: 6.9 g/dL (ref 6.5–8.1)

## 2020-08-27 LAB — CBG MONITORING, ED
Glucose-Capillary: 133 mg/dL — ABNORMAL HIGH (ref 70–99)
Glucose-Capillary: 101 mg/dL — ABNORMAL HIGH (ref 70–99)

## 2020-08-27 LAB — LIPID PANEL
Cholesterol: 92 mg/dL (ref 0–200)
HDL: 31 mg/dL — ABNORMAL LOW (ref >40)
LDL Cholesterol: 24 mg/dL (ref 0–99)
Total CHOL/HDL Ratio: 3 RATIO
Triglycerides: 186 mg/dL — ABNORMAL HIGH (ref <150)
VLDL: 37 mg/dL (ref 0–40)

## 2020-08-27 LAB — GLUCOSE, CAPILLARY: Glucose-Capillary: 105 mg/dL — ABNORMAL HIGH (ref 70–99)

## 2020-08-27 LAB — HEPARIN LEVEL (UNFRACTIONATED): Heparin Unfractionated: 0.22 IU/mL — ABNORMAL LOW (ref 0.30–0.70)

## 2020-08-27 SURGERY — LEFT HEART CATH AND CORONARY ANGIOGRAPHY
Anesthesia: LOCAL

## 2020-08-27 MED ORDER — HEPARIN BOLUS VIA INFUSION
1400.0000 [IU] | Freq: Once | INTRAVENOUS | Status: AC
  Administered 2020-08-27: 1400 [IU] via INTRAVENOUS
  Filled 2020-08-27: qty 1400

## 2020-08-27 MED ORDER — HEPARIN SODIUM (PORCINE) 1000 UNIT/ML IJ SOLN
INTRAMUSCULAR | Status: DC | PRN
  Administered 2020-08-27: 5000 [IU] via INTRAVENOUS

## 2020-08-27 MED ORDER — FENTANYL CITRATE (PF) 100 MCG/2ML IJ SOLN
INTRAMUSCULAR | Status: DC | PRN
  Administered 2020-08-27: 25 ug via INTRAVENOUS

## 2020-08-27 MED ORDER — MERCAPTOPURINE 50 MG PO TABS
50.0000 mg | ORAL_TABLET | Freq: Every day | ORAL | Status: DC
  Administered 2020-08-27: 50 mg via ORAL

## 2020-08-27 MED ORDER — HEPARIN (PORCINE) IN NACL 1000-0.9 UT/500ML-% IV SOLN
INTRAVENOUS | Status: DC | PRN
  Administered 2020-08-27 (×2): 500 mL

## 2020-08-27 MED ORDER — SODIUM CHLORIDE 0.9% FLUSH: 3.0000 mL | INTRAVENOUS | Status: DC | PRN

## 2020-08-27 MED ORDER — SODIUM CHLORIDE 0.9% FLUSH: 3.0000 mL | Freq: Two times a day (BID) | INTRAVENOUS | Status: DC

## 2020-08-27 MED ORDER — LABETALOL HCL 5 MG/ML IV SOLN: 10.0000 mg | INTRAVENOUS | Status: DC | PRN

## 2020-08-27 MED ORDER — VERAPAMIL HCL 2.5 MG/ML IV SOLN
INTRAVENOUS | Status: AC
  Filled 2020-08-27: qty 2

## 2020-08-27 MED ORDER — MIDAZOLAM HCL 2 MG/2ML IJ SOLN
INTRAMUSCULAR | Status: DC | PRN
  Administered 2020-08-27: 2 mg via INTRAVENOUS

## 2020-08-27 MED ORDER — FENTANYL CITRATE (PF) 100 MCG/2ML IJ SOLN
INTRAMUSCULAR | Status: AC
  Filled 2020-08-27: qty 2

## 2020-08-27 MED ORDER — IOHEXOL 350 MG/ML SOLN
INTRAVENOUS | Status: DC | PRN
  Administered 2020-08-27: 80 mL

## 2020-08-27 MED ORDER — SODIUM CHLORIDE 0.9 % IV SOLN: INTRAVENOUS | Status: DC

## 2020-08-27 MED ORDER — MIDAZOLAM HCL 2 MG/2ML IJ SOLN
INTRAMUSCULAR | Status: AC
  Filled 2020-08-27: qty 2

## 2020-08-27 MED ORDER — MESALAMINE ER 0.375 G PO CP24: 1.5000 g | ORAL_CAPSULE | Freq: Every morning | ORAL | Status: DC

## 2020-08-27 MED ORDER — LIDOCAINE HCL (PF) 1 % IJ SOLN
INTRAMUSCULAR | Status: DC | PRN
  Administered 2020-08-27: 2 mL

## 2020-08-27 MED ORDER — SODIUM CHLORIDE 0.9 % IV SOLN: 250.0000 mL | INTRAVENOUS | Status: DC | PRN

## 2020-08-27 MED ORDER — HEPARIN SODIUM (PORCINE) 1000 UNIT/ML IJ SOLN
INTRAMUSCULAR | Status: AC
  Filled 2020-08-27: qty 1

## 2020-08-27 MED ORDER — VERAPAMIL HCL 2.5 MG/ML IV SOLN
INTRAVENOUS | Status: DC | PRN
  Administered 2020-08-27: 10 mL via INTRA_ARTERIAL

## 2020-08-27 MED ORDER — HYDRALAZINE HCL 20 MG/ML IJ SOLN
10.0000 mg | INTRAMUSCULAR | Status: DC | PRN
  Filled 2020-08-27: qty 1

## 2020-08-27 SURGICAL SUPPLY — 9 items
CATH OPTITORQUE TIG 4.0 5F (CATHETERS) ×2 IMPLANT
DEVICE RAD COMP TR BAND LRG (VASCULAR PRODUCTS) ×2 IMPLANT
GLIDESHEATH SLEND SS 6F .021 (SHEATH) ×2 IMPLANT
GUIDEWIRE INQWIRE 1.5J.035X260 (WIRE) ×1 IMPLANT
INQWIRE 1.5J .035X260CM (WIRE) ×2
KIT HEART LEFT (KITS) ×2 IMPLANT
PACK CARDIAC CATHETERIZATION (CUSTOM PROCEDURE TRAY) ×2 IMPLANT
TRANSDUCER W/STOPCOCK (MISCELLANEOUS) ×2 IMPLANT
TUBING CIL FLEX 10 FLL-RA (TUBING) ×2 IMPLANT

## 2020-08-27 NOTE — H&P (View-Only) (Signed)
Progress Note  Patient Name: Jesus Stone Date of Encounter: 08/27/2020  [facility] Cardiologist: Belva Crome III, MD   Subjective   At rest there has been no recurrence of chest pain.  Symptoms are similar to what he has had in the past.  Duration of symptoms with negative EKG and markers raise the possibility of noncardiac chest pain.  Inpatient Medications    Scheduled Meds:  aspirin EC  81 mg Oral Daily   fenofibrate  160 mg Oral Daily   hydrochlorothiazide  12.5 mg Oral Daily   insulin aspart  0-15 Units Subcutaneous TID WC   irbesartan  300 mg Oral Daily   mercaptopurine  50 mg Oral Daily   [START ON 08/28/2020] mesalamine  1.5 g Oral q AM   rosuvastatin  20 mg Oral Daily   sodium chloride flush  3 mL Intravenous Q12H   Continuous Infusions:  sodium chloride     sodium chloride 1 mL/kg/hr (08/27/20 0528)   heparin 1,500 Units/hr (08/27/20 0920)   PRN Meds: sodium chloride, acetaminophen, nitroGLYCERIN, ondansetron (ZOFRAN) IV, sodium chloride flush   Vital Signs    Vitals:   08/27/20 0221 08/27/20 0524 08/27/20 0900 08/27/20 0915  BP: (!) 144/80 131/83 135/71 135/71  Pulse: (!) 58 (!) 54 (!) 53   Resp: '17 12 13   '$ Temp:      TempSrc:  Oral    SpO2: 100% 98% 99%   Weight:      Height:       No intake or output data in the 24 hours ending 08/27/20 1133 Last 3 Weights 08/26/2020 01/02/2020 12/07/2018  Weight (lbs) 218 lb 219 lb 228 lb  Weight (kg) 98.884 kg 99.338 kg 103.42 kg      Telemetry    Normal sinus rhythm- Personally Reviewed  ECG    A new tracing has not been repeated since admission- Personally Reviewed  Physical Exam  Obese GEN: No acute distress.   Neck: No JVD Cardiac: RRR, no murmurs, rubs, or gallops.  Respiratory: Clear to auscultation bilaterally. GI: Soft, nontender, non-distended  MS: No edema; No deformity. Neuro:  Nonfocal  Psych: Normal affect   Labs    High Sensitivity Troponin:   Recent Labs  Lab  08/26/20 1037 08/26/20 1353  TROPONINIHS 4 4      Chemistry Recent Labs  Lab 08/26/20 1037 08/27/20 0500  NA 135 137  K 4.1 3.9  CL 103 103  CO2 23 24  GLUCOSE 127* 115*  BUN 22 18  CREATININE 1.26* 1.07  CALCIUM 9.3 9.0  PROT  --  6.9  ALBUMIN  --  3.4*  AST  --  24  ALT  --  22  ALKPHOS  --  40  BILITOT  --  0.6  GFRNONAA >60 >60  ANIONGAP 9 10     Hematology Recent Labs  Lab 08/26/20 1037 08/27/20 0500  WBC 4.4 5.3  RBC 4.47 4.25  HGB 13.4 12.7*  HCT 40.1 38.8*  MCV 89.7 91.3  MCH 30.0 29.9  MCHC 33.4 32.7  RDW 13.3 13.4  PLT 242 219    BNPNo results for input(s): BNP, PROBNP in the last 168 hours.   DDimer No results for input(s): DDIMER in the last 168 hours.   Radiology    DG Chest 2 View  Result Date: 08/26/2020 CLINICAL DATA:  Chest pain EXAM: CHEST - 2 VIEW COMPARISON:  04/25/2015 FINDINGS: The heart size and mediastinal contours are within normal  limits. Both lungs are clear. The visualized skeletal structures are unremarkable. IMPRESSION: No active cardiopulmonary disease. Electronically Signed   By: Kerby Moors M.D.   On: 08/26/2020 11:27    Cardiac Studies   No new cardiac data.  Planning to get coronary angiography today to rule out subtotal occlusion of the ramus with collaterals.  Patient Profile     65 y.o. male with a hx of CAD s/p BMS x 2 to ramus in 2000 and DES in 2017 for ISR of ramus, HTN, HLD, DM2, OSA, TIA, and obesity who is being seen 08/26/2020 for the evaluation of chest pain.  Assessment & Plan    Angina pectoris: Chest pain is reminiscent of prior angina.  Duration and intensity without EKG changes or elevated markers suggest this is not ischemic pain.  Rule out in-stent restenosis in the ramus branch with collaterals. CAD: Coronary angiography is being pulled form today to rule out progression of CAD. Hyperlipidemia: LDL is 24 triglycerides 186.  If this is an acute event, will need to start Vascepa therapy.   Continue high intensity statin therapy. Obstructive sleep apnea: Encouraged compliance with CPAP  For questions or updates, please contact [place] Please consult [email or web address] for contact info under        Signed, Sinclair Grooms, MD  08/27/2020, 11:33 AM

## 2020-08-27 NOTE — Progress Notes (Signed)
Progress Note  Patient Name: Jesus Stone Date of Encounter: 08/27/2020  [facility] Cardiologist: Belva Crome III, MD   Subjective   At rest there has been no recurrence of chest pain.  Symptoms are similar to what he has had in the past.  Duration of symptoms with negative EKG and markers raise the possibility of noncardiac chest pain.  Inpatient Medications    Scheduled Meds:  aspirin EC  81 mg Oral Daily   fenofibrate  160 mg Oral Daily   hydrochlorothiazide  12.5 mg Oral Daily   insulin aspart  0-15 Units Subcutaneous TID WC   irbesartan  300 mg Oral Daily   mercaptopurine  50 mg Oral Daily   [START ON 08/28/2020] mesalamine  1.5 g Oral q AM   rosuvastatin  20 mg Oral Daily   sodium chloride flush  3 mL Intravenous Q12H   Continuous Infusions:  sodium chloride     sodium chloride 1 mL/kg/hr (08/27/20 0528)   heparin 1,500 Units/hr (08/27/20 0920)   PRN Meds: sodium chloride, acetaminophen, nitroGLYCERIN, ondansetron (ZOFRAN) IV, sodium chloride flush   Vital Signs    Vitals:   08/27/20 0221 08/27/20 0524 08/27/20 0900 08/27/20 0915  BP: (!) 144/80 131/83 135/71 135/71  Pulse: (!) 58 (!) 54 (!) 53   Resp: '17 12 13   '$ Temp:      TempSrc:  Oral    SpO2: 100% 98% 99%   Weight:      Height:       No intake or output data in the 24 hours ending 08/27/20 1133 Last 3 Weights 08/26/2020 01/02/2020 12/07/2018  Weight (lbs) 218 lb 219 lb 228 lb  Weight (kg) 98.884 kg 99.338 kg 103.42 kg      Telemetry    Normal sinus rhythm- Personally Reviewed  ECG    A new tracing has not been repeated since admission- Personally Reviewed  Physical Exam  Obese GEN: No acute distress.   Neck: No JVD Cardiac: RRR, no murmurs, rubs, or gallops.  Respiratory: Clear to auscultation bilaterally. GI: Soft, nontender, non-distended  MS: No edema; No deformity. Neuro:  Nonfocal  Psych: Normal affect   Labs    High Sensitivity Troponin:   Recent Labs  Lab  08/26/20 1037 08/26/20 1353  TROPONINIHS 4 4      Chemistry Recent Labs  Lab 08/26/20 1037 08/27/20 0500  NA 135 137  K 4.1 3.9  CL 103 103  CO2 23 24  GLUCOSE 127* 115*  BUN 22 18  CREATININE 1.26* 1.07  CALCIUM 9.3 9.0  PROT  --  6.9  ALBUMIN  --  3.4*  AST  --  24  ALT  --  22  ALKPHOS  --  40  BILITOT  --  0.6  GFRNONAA >60 >60  ANIONGAP 9 10     Hematology Recent Labs  Lab 08/26/20 1037 08/27/20 0500  WBC 4.4 5.3  RBC 4.47 4.25  HGB 13.4 12.7*  HCT 40.1 38.8*  MCV 89.7 91.3  MCH 30.0 29.9  MCHC 33.4 32.7  RDW 13.3 13.4  PLT 242 219    BNPNo results for input(s): BNP, PROBNP in the last 168 hours.   DDimer No results for input(s): DDIMER in the last 168 hours.   Radiology    DG Chest 2 View  Result Date: 08/26/2020 CLINICAL DATA:  Chest pain EXAM: CHEST - 2 VIEW COMPARISON:  04/25/2015 FINDINGS: The heart size and mediastinal contours are within normal  limits. Both lungs are clear. The visualized skeletal structures are unremarkable. IMPRESSION: No active cardiopulmonary disease. Electronically Signed   By: Kerby Moors M.D.   On: 08/26/2020 11:27    Cardiac Studies   No new cardiac data.  Planning to get coronary angiography today to rule out subtotal occlusion of the ramus with collaterals.  Patient Profile     65 y.o. male with a hx of CAD s/p BMS x 2 to ramus in 2000 and DES in 2017 for ISR of ramus, HTN, HLD, DM2, OSA, TIA, and obesity who is being seen 08/26/2020 for the evaluation of chest pain.  Assessment & Plan    Angina pectoris: Chest pain is reminiscent of prior angina.  Duration and intensity without EKG changes or elevated markers suggest this is not ischemic pain.  Rule out in-stent restenosis in the ramus branch with collaterals. CAD: Coronary angiography is being pulled form today to rule out progression of CAD. Hyperlipidemia: LDL is 24 triglycerides 186.  If this is an acute event, will need to start Vascepa therapy.   Continue high intensity statin therapy. Obstructive sleep apnea: Encouraged compliance with CPAP  For questions or updates, please contact [place] Please consult [email or web address] for contact info under        Signed, Sinclair Grooms, MD  08/27/2020, 11:33 AM

## 2020-08-27 NOTE — Progress Notes (Signed)
ANTICOAGULATION CONSULT NOTE - Follow Up Consult  Pharmacy Consult for heparin Indication: chest pain/ACS  Allergies  Allergen Reactions   Lipitor [Atorvastatin] Other (See Comments)    Memory loss   Simvastatin     Memory loss    Patient Measurements: Height: '5\' 11"'$  (180.3 cm) Weight: 98.9 kg (218 lb) IBW/kg (Calculated) : 75.3 Heparin Dosing Weight: 95.6 kg  Vital Signs: Temp Source: Oral (07/26 0524) BP: 131/83 (07/26 0524) Pulse Rate: 54 (07/26 0524)  Labs: Recent Labs    08/26/20 1037 08/26/20 1353 08/26/20 1633 08/27/20 0500  HGB 13.4  --   --  12.7*  HCT 40.1  --   --  38.8*  PLT 242  --   --  219  LABPROT  --   --  14.3  --   INR  --   --  1.1  --   HEPARINUNFRC  --   --   --  0.22*  CREATININE 1.26*  --   --  1.07  TROPONINIHS 4 4  --   --     Estimated Creatinine Clearance: 80.3 mL/min (by C-G formula based on SCr of 1.07 mg/dL).  Assessment: 30 yom with a history of CAD s/p BMS x 2 and DES, HTN, HLD, DM2, OSA, TIA, and obesity that presented to ED with chest pain.   HL resulted this at 0.22, subtherapeutic (goal 0.3-0.7). No s/sx of bleeding per RN, IV site okay.  Goal of Therapy:  INR 2-3 Monitor platelets by anticoagulation protocol: Yes   Plan:  Give 1400 units bolus x 1 and increase infusion to 1500units/hr (inc by 2u/kg/hr from previous rate) -f/u 6h HL  -Monitor daily HL, CBC, and any s/sx of bleeding  Joetta Manners, PharmD, [facility] Clinical Pharmacist ED RPh Phone: [place]: [phone]

## 2020-08-27 NOTE — Discharge Summary (Addendum)
The patient has been seen in conjunction with Rosaria Ferries, PAC. All aspects of care have been considered and discussed. The patient has been personally interviewed, examined, and all clinical data has been reviewed.  Doing well post cath.  Moderate LAD plaque. Ramus without ISR Cath site unremarkable. Discharge with resumption of typical activity in 72 hours. Notify if recurrent symptoms.  Discharge Summary    Patient ID: Jesus Stone MRN: [id number]; DOB: 1956-01-02  Admit date: 08/26/2020 Discharge date: 08/27/2020  PCP:  Josetta Huddle, MD   [facility] Providers Cardiologist:  Sinclair Grooms, MD        Discharge Diagnoses    Active Problems:   Unstable angina [facility])    Diagnostic Studies/Procedures    CARDIAC CATH: 08/27/2020    Prox LAD lesion is 40% stenosed.   Mid LAD lesion is 45% stenosed.   Prox RCA to Mid RCA lesion is 25% stenosed.   RPDA lesion is 30% stenosed.   Previously placed Ramus stent (unknown type) is  widely patent.   The left ventricular systolic function is normal.   LV end diastolic pressure is normal.   The left ventricular ejection fraction is 55-65% by visual estimate.   There is no aortic valve stenosis.  Diagnostic Dominance: Right    _____________   History of Present Illness     BURRELL WIES is a 65 y.o. male with  hx of CAD s/p BMS x 2 to ramus in 2000 and DES in 2017 for ISR of ramus, HTN, HLD, DM2, OSA, TIA, and obesity who came to the ER on 7/29 with chest pain that reminded him of his pre-PCI symptoms.  Hospital Course     Consultants: None  Angina pectoris: Chest pain is reminiscent of prior angina.  Duration and intensity without EKG changes or elevated markers suggests this is not ischemic pain.  Cardiac catheterization did not show any obstructive disease.  Medical therapy is recommended.  If he continues to have episodes of pain, consider Imdur for small vessel disease. CAD: Coronary angiography was  performed to rule out progression of CAD.  Cardiac catheterization results are above.  He had nonobstructive disease in multiple vessels, no clear culprit lesion.  Medical therapy indicated. Hyperlipidemia: LDL is 24, triglycerides 186. Continue high intensity statin therapy. Obstructive sleep apnea: Encouraged compliance with CPAP  Post cath, he was asymptomatic and ambulating without chest pain or shortness of breath.  No further inpatient work-up is indicated and he is considered stable for discharge, to follow-up as an outpatient.  Did the patient have an acute coronary syndrome (MI, NSTEMI, STEMI, etc) this admission?:  No                               Did the patient have a percutaneous coronary intervention (stent / angioplasty)?:  No.       _____________  Discharge Vitals Blood pressure (!) 153/72, pulse 66, temperature 98 F (36.7 C), resp. rate 12, height '5\' 11"'$  (1.803 m), weight 98.9 kg, SpO2 98 %.  Filed Weights   08/26/20 1353  Weight: 98.9 kg    Labs & Radiologic Studies    CBC Recent Labs    08/26/20 1037 08/27/20 0500  WBC 4.4 5.3  HGB 13.4 12.7*  HCT 40.1 38.8*  MCV 89.7 91.3  PLT 242 [id number]   Basic Metabolic Panel Recent Labs    08/26/20 1037 08/27/20 0500  NA  135 137  K 4.1 3.9  CL 103 103  CO2 23 24  GLUCOSE 127* 115*  BUN 22 18  CREATININE 1.26* 1.07  CALCIUM 9.3 9.0   Liver Function Tests Recent Labs    08/27/20 0500  AST 24  ALT 22  ALKPHOS 40  BILITOT 0.6  PROT 6.9  ALBUMIN 3.4*   No results for input(s): LIPASE, AMYLASE in the last 72 hours. High Sensitivity Troponin:   Recent Labs  Lab 08/26/20 1037 08/26/20 1353  TROPONINIHS 4 4    BNP Invalid input(s): POCBNP D-Dimer No results for input(s): DDIMER in the last 72 hours. Hemoglobin A1C No results for input(s): HGBA1C in the last 72 hours. Fasting Lipid Panel Recent Labs    08/27/20 0500  CHOL 92  HDL 31*  LDLCALC 24  TRIG 186*  CHOLHDL 3.0   Thyroid Function  Tests No results for input(s): TSH, T4TOTAL, T3FREE, THYROIDAB in the last 72 hours.  Invalid input(s): FREET3 _____________  DG Chest 2 View  Result Date: 08/26/2020 CLINICAL DATA:  Chest pain EXAM: CHEST - 2 VIEW COMPARISON:  04/25/2015 FINDINGS: The heart size and mediastinal contours are within normal limits. Both lungs are clear. The visualized skeletal structures are unremarkable. IMPRESSION: No active cardiopulmonary disease. Electronically Signed   By: Kerby Moors M.D.   On: 08/26/2020 11:27   CARDIAC CATHETERIZATION  Result Date: 08/27/2020 Formatting of this result is different from the original.   Previously placed Ramus stent (several previous stents covered with 1 single 3.0 mL) 38 mm DES) is  widely patent.   Prox LAD lesion is 40% stenosed.  Mid LAD lesion is 45% stenosed.   Prox RCA to Mid RCA lesion is 25% stenosed.   RPDA lesion is 30% stenosed.   The left ventricular systolic function is normal.  The left ventricular ejection fraction is 55-65% by visual estimate.   LV end diastolic pressure is normal.   There is no aortic valve stenosis. SUMMARY Stable multivessel CAD with: Widely patent Ramus Intermedius Stent, moderate proximal and mid LAD disease, and mild RCA/RPDA disease. Suspect nonanginal chest pain Normal/preserved EF with normal EDP.   Disposition   Pt is being discharged home today in good condition.  Follow-up Plans & Appointments     Follow-up [facility] Follow up on 09/18/2020.   Specialty: Cardiology Why: Please arrive at 8:15 am for an 8:30 am appt. Note that this location is not the same as Dr. Thompson Caul office. Contact information: [address] [place] [phone]               Discharge Instructions     Diet - low sodium heart healthy   Complete by: As directed    Increase activity slowly   Complete by: As directed        Discharge  Medications   Allergies as of 08/27/2020       Reactions   Lipitor [atorvastatin] Other (See Comments)   Memory loss   Simvastatin    Memory loss        Medication List     TAKE these medications    Apriso 0.375 g 24 hr capsule Generic drug: mesalamine Take 1,500 mg by mouth in the morning.   fenofibrate 145 MG tablet Commonly known as: TRICOR Take 145 mg by mouth daily.   mercaptopurine 50 MG tablet Commonly known as: PURINETHOL Take 1 tablet by mouth daily.   metFORMIN  500 MG tablet Commonly known as: GLUCOPHAGE Take 500-1,000 mg by mouth See admin instructions. Take 1 tablet (500 mg) in the morning and Take 2 tablets (1000 mg) in the evening Notes to patient: HOLD for 48 hours, restart on 08/30/2020.   nitroGLYCERIN 0.4 MG SL tablet Commonly known as: NITROSTAT Place 1 tablet (0.4 mg total) under the tongue every 5 (five) minutes as needed for chest pain (x 3 doses).   Omega-3 1000 MG Caps Take 1 capsule by mouth 2 (two) times daily.   rosuvastatin 20 MG tablet Commonly known as: CRESTOR TAKE ONE TABLET BY MOUTH ONE TIME DAILY   valsartan-hydrochlorothiazide 320-12.5 MG tablet Commonly known as: DIOVAN-HCT TAKE ONE TABLET BY MOUTH ONE TIME DAILY   Vitamin D 50 MCG (2000 UT) Caps Take 1 capsule by mouth daily.   Zinc 50 MG Tabs Take 1 tablet by mouth 3 (three) times a week. MWF       ASK your doctor about these medications    aspirin EC 81 MG tablet Take 1 tablet (81 mg total) by mouth every Monday, Wednesday, and Friday. Swallow whole.           Outstanding Labs/Studies   None  Duration of Discharge Encounter   Greater than 30 minutes including physician time.  Signed, Rosaria Ferries, PA-C 08/27/2020, 6:11 PM

## 2020-08-27 NOTE — Interval H&P Note (Signed)
History and Physical Interval Note:  08/27/2020 3:08 PM  Jesus Stone  has presented today for surgery, with the diagnosis of chest pain /concerning for unstable angina, coronary disease with other forms of angina.  The various methods of treatment have been discussed with the patient and family. After consideration of risks, benefits and other options for treatment, the patient has consented to  Procedure(s): LEFT HEART CATH AND CORONARY ANGIOGRAPHY (N/A)  PERCUTANEOUS CORONARY INTERVENTION  as a surgical intervention.  The patient's history has been reviewed, patient examined, no change in status, stable for surgery.  I have reviewed the patient's chart and labs.  Questions were answered to the patient's satisfaction.    Cath Lab Visit (complete for each Cath Lab visit)  Clinical Evaluation Leading to the Procedure:   ACS: Yes.    Non-ACS:    Anginal Classification: CCS III  Anti-ischemic medical therapy: Minimal Therapy (1 class of medications)  Non-Invasive Test Results: No non-invasive testing performed  Prior CABG: No previous CABG    Glenetta Hew

## 2020-08-28 ENCOUNTER — Other Ambulatory Visit: Payer: Self-pay | Admitting: *Deleted

## 2020-08-28 ENCOUNTER — Encounter (HOSPITAL_COMMUNITY): Payer: Self-pay | Admitting: Cardiology

## 2020-08-28 DIAGNOSIS — E785 Hyperlipidemia, unspecified: Secondary | ICD-10-CM

## 2020-08-28 LAB — HEMOGLOBIN A1C
Hgb A1c MFr Bld: 6.9 % — ABNORMAL HIGH (ref 4.8–5.6)
Mean Plasma Glucose: 151 mg/dL

## 2020-08-28 NOTE — Progress Notes (Signed)
Belva Crome, MD  Loren Racer, RN; Marcelle Overlie D, RPH-CPP Please get him in the lipid clinic. Having intolerance to statins. He is highrisk.    Referral placed

## 2020-08-29 ENCOUNTER — Telehealth: Payer: Self-pay | Admitting: Interventional Cardiology

## 2020-08-29 DIAGNOSIS — E119 Type 2 diabetes mellitus without complications: Secondary | ICD-10-CM | POA: Diagnosis not present

## 2020-08-29 NOTE — Telephone Encounter (Signed)
Spoke with pt and reviewed discharge instructions and care for site.  Also scheduled pt for his Lipid Clinic appt.  Pt very appreciative for call.

## 2020-08-29 NOTE — Telephone Encounter (Signed)
Pt would like to know when he is able to remove dressing and take a shower where the effected area is... please advise

## 2020-09-16 NOTE — Progress Notes (Signed)
Patient ID: Jesus Stone                 DOB: 1955-06-13                    MRN: [id number]    HPI: COURVOISIER VREDEVELD is a 65 y.o. male patient referred to lipid clinic by Dr. Tamala Julian. PMH is significant for CAD s/p BMS in 2000 and DES in 2017 for in-stent restenosis of ramus, HTN, HLD, T2DM, OSA, obesity. Last seen in the office Dr. Tamala Julian 01/02/20, but was recently admitted to [facility] 7/25-26 and seen by Dr. Tamala Julian there. Presented with chest pain, proceeded with cath to rule out progression of CAD. S/p cath 08/27/20 (prox LAD 40% stenosed, mLAD 45%, prox RCA to mid RCA 25%, RPDA 30%, Ramus stent patent), showed nonobstructive CAD, medical therapy indicated. Discharged on rosuvastatin 20 mg daily. Referred to lipid clinic 7/27 for statin intolerance.  Today, patient arrives in good spirits. States that he had been on rosuvastatin for a while then once he got his [place] vaccines, he started to experience neck pain/stiffness where he could not move or turn his neck at all. His PCP Dr. Inda Merlin had him stop his statin to see if that improved the pain and his neck stiffness resolved after 1 month. He isn't sure of the timing but at some point he resumed rosuvastatin and was taking it at the time of his lipid panel while hospitalized in July. He discussed the neck pain with Dr. Tamala Julian at that time who had him hold it for 1 week which again improved his neck pain. He has been taking it now for a little over a week and reports some mild stiffness but no major issues. Reports he and his wife have made significant lifestyle changes in their diet over the years.   Current Medications: rosuvastatin 20 mg daily, fenofibrate 145 mg daily Intolerances: atorvastatin, simvastatin (memory loss with both) Risk Factors: CAD, HTN, HLD, T2DM  LDL goal: <55 mg/dL  Diet:  -Breakfast: 2-3 eggs fried in avocado oil, piece of toast occasionally ([employer] killer bread), coffee (no Jesus/flavored creamers, drinks black),  [employer] almond milk -Lunch/dinner: green beans, collard greens, broccoli, brussels; leaner meats (grilled chicken or fish, no fried foods) -Snacks: apple/day, bananas, nuts, avoids sweets -Drinks: water, sweet/unsweet tea, no sodas or alcohol  Exercise: Used to walk 3.5-4.5 miles/day with his wife but now cares for his grandson during the day, stays active gardening and in the yard. He expects to be able to resume walking every day at the end of August as his daughter will be keeping his grandson from then on.   Family History: Cancer in his maternal grandmother, mother, and paternal aunt; Diabetes in his sister.  Social History: Former smoker (quit 1986)  Labs: 08/27/20: TC 92, TG 186, HDL 31, LDL 24 (rosuvastatin '20mg'$  daily)  Past Medical History:  Diagnosis Date   Acute sinusitis, unspecified    Anal polyp    Bell's palsy    CAD (coronary artery disease)    a. BMSx2 to ramus 2000  b. 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   Crohn's disease ([place])    Diabetes mellitus, type 2 (HCC)    Gout, unspecified    HLD (hyperlipidemia)    HTN (hypertension)    Nontoxic multinodular goiter    Obesity    Periodic limb movement disorder  Sleep apnea    Squamous cell carcinoma of skin 04/04/2014   well diff-right crown (CX35FU)    Squamous cell carcinoma of skin 11/08/2018   left medial scalp ([facility])   Squamous cell carcinoma of skin 11/08/2018   KA-left preauricular- [place]   Thyroid nodule    TIA (transient ischemic attack)    Unspecified vitamin D deficiency     Current Outpatient Medications on File Prior to Visit  Medication Sig Dispense Refill   APRISO 0.375 g 24 hr capsule Take 1,500 mg by mouth in the morning.     aspirin EC 81 MG tablet Take 1 tablet (81 mg total) by mouth every Monday, Wednesday, and Friday. Swallow whole. (Patient taking differently: Take 81 mg by mouth daily. Swallow whole.) 45 tablet 3    Cholecalciferol (VITAMIN D) 50 MCG (2000 UT) CAPS Take 1 capsule by mouth daily.     fenofibrate (TRICOR) 145 MG tablet Take 145 mg by mouth daily.     mercaptopurine (PURINETHOL) 50 MG tablet Take 1 tablet by mouth daily.      metFORMIN (GLUCOPHAGE) 500 MG tablet Take 500-1,000 mg by mouth See admin instructions. Take 1 tablet (500 mg) in the morning and Take 2 tablets (1000 mg) in the evening     nitroGLYCERIN (NITROSTAT) 0.4 MG SL tablet Place 1 tablet (0.4 mg total) under the tongue every 5 (five) minutes as needed for chest pain (x 3 doses). 100 tablet 1   Omega-3 1000 MG CAPS Take 1 capsule by mouth 2 (two) times daily.      rosuvastatin (CRESTOR) 20 MG tablet TAKE ONE TABLET BY MOUTH ONE TIME DAILY 90 tablet 3   valsartan-hydrochlorothiazide (DIOVAN-HCT) 320-12.5 MG tablet TAKE ONE TABLET BY MOUTH ONE TIME DAILY 90 tablet 3   Zinc 50 MG TABS Take 1 tablet by mouth 3 (three) times a week. MWF     No current facility-administered medications on file prior to visit.    Allergies  Allergen Reactions   Lipitor [Atorvastatin] Other (See Comments)    Memory loss   Simvastatin     Memory loss    Assessment/Plan:  1. Hyperlipidemia - Most recent LDL of 24 is at goal <55 mg/dL (given multiple ASCVD events, DM) on rosuvastatin 20 mg daily. He knows that the rosuvastatin is what is keeping his LDL at goal. Discussed decreasing rosuvastatin to 5 mg and adding ezetimibe to see if this improves his neck pain/stiffness but keeps his LDL at goal. Since his neck pain is not significant currently, he would like to stay on rosuvastatin 20 mg daily and if it worsens, he will call and we will try decreasing statin/adding ezetimibe. Also discussed triglycerides of 186 not at goal <150 mg/dL. He reports he used to have TG in 600s at one time, which have improved with fenofibrate and improving his diabetes control. Most recent A1c at goal <7. He does not eat sweets or drink alcohol. Only areas for improvement  in his diet from a TG perspective would be decreasing sweet tea which he mixes with unsweet tea and trading out fruits higher in Jesus for those that are lower such as berries. He is also planning to resume walking 3.5-4.5 miles per day at the end of August. Discussed switching his over the counter fish oil to a prescription strength fish oil, Vascepa, but with his insurance the cost is $100/month or $200 per 90 day supply. He would like to continue fenofibrate, focus on his diet, and increase his  exercise with walking and recheck his lipids in a few months first, then revisit starting Vascepa if TG remain elevated.   2. Chest pain - When patient had recent chest pain and hospitalization, he did not have any nitroglycerin on hand as his last prescription was from 2017 so he took a handful of full strength aspirins. Provided refill for nitroglycerin today so he has some on hand if needed.   He is scheduled for fasting lipid panel and LFTs on October 18th.   Rebbeca Paul, PharmD PGY2 Ambulatory Care Pharmacy Resident 09/17/2020 10:25 AM

## 2020-09-17 ENCOUNTER — Other Ambulatory Visit: Payer: Self-pay

## 2020-09-17 ENCOUNTER — Ambulatory Visit (INDEPENDENT_AMBULATORY_CARE_PROVIDER_SITE_OTHER): Payer: PPO | Admitting: Student-PharmD

## 2020-09-17 DIAGNOSIS — R079 Chest pain, unspecified: Secondary | ICD-10-CM | POA: Diagnosis not present

## 2020-09-17 DIAGNOSIS — E785 Hyperlipidemia, unspecified: Secondary | ICD-10-CM

## 2020-09-17 MED ORDER — NITROGLYCERIN 0.4 MG SL SUBL: 0.4000 mg | SUBLINGUAL_TABLET | SUBLINGUAL | 1 refills | Status: DC | PRN

## 2020-09-17 NOTE — Progress Notes (Signed)
Office Visit    Patient Name: Jesus Stone Date of Encounter: 09/18/2020  PCP:  Josetta Huddle, MD   [facility]  Cardiologist:  Sinclair Grooms, MD  Advanced Practice Provider:  No care team member to display Electrophysiologist:  None    Chief Complaint    Jesus Stone is a 65 y.o. male with a hx of  CAD (BMS to ramus in 2000, DES in 2017 for ISR ramus), hypertension, hyperlipidemia, DM2, OSA, TIA, obesity  presents today for hospital follow up.    Past Medical History    Past Medical History:  Diagnosis Date   Acute sinusitis, unspecified    Anal polyp    Bell's palsy    CAD (coronary artery disease)    a. BMSx2 to ramus 2000  b. 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   Crohn's disease ([place])    Diabetes mellitus, type 2 (HCC)    Gout, unspecified    HLD (hyperlipidemia)    HTN (hypertension)    Nontoxic multinodular goiter    Obesity    Periodic limb movement disorder    Sleep apnea    Squamous cell carcinoma of skin 04/04/2014   well diff-right crown (CX35FU)    Squamous cell carcinoma of skin 11/08/2018   left medial scalp ([facility])   Squamous cell carcinoma of skin 11/08/2018   KA-left preauricular- [place]   Thyroid nodule    TIA (transient ischemic attack)    Unspecified vitamin D deficiency    Past Surgical History:  Procedure Laterality Date   APPENDECTOMY  1964   CARDIAC CATHETERIZATION N/A 04/26/2015   Procedure: Left Heart Cath and Coronary Angiography;  Surgeon: Peter M Martinique, MD;  Location: [facility];  Service: Cardiovascular;  Laterality: N/A;   CARDIAC CATHETERIZATION N/A 04/26/2015   Procedure: Intravascular Pressure Wire/FFR Study;  Surgeon: Peter M Martinique, MD;  Location: [place] [facility];  Service: Cardiovascular;  Laterality: N/A;   CARDIAC CATHETERIZATION N/A 04/26/2015   Procedure: Coronary Stent Intervention;  Surgeon: Peter M Martinique, MD;  Location:  [place] [facility];  Service: Cardiovascular;  Laterality: N/A;   Coronary artery stent placment  03/1998   HEEL SPUR EXCISION Right 2008   LEFT HEART CATH AND CORONARY ANGIOGRAPHY N/A 08/27/2020   Procedure: LEFT HEART CATH AND CORONARY ANGIOGRAPHY;  Surgeon: Leonie Man, MD;  Location: [facility];  Service: Cardiovascular;  Laterality: N/A;   RECTAL POLYPECTOMY  2008, 2009   TESTICLE SURGERY  1967    Allergies  Allergies  Allergen Reactions   Lipitor [Atorvastatin] Other (See Comments)    Memory loss   Simvastatin     Memory loss    History of Present Illness    Jesus Stone is a 65 y.o. male with a hx of CAD (BMS to ramus in 2000, DES in 2017 for ISR ramus), hypertension, hyperlipidemia, DM2, OSA, TIA, obesity last seen 09/17/20 by pharmacist in lipid clinic.  He was admitted 08/26/2020 due to chest pain.  Cardiac catheterization was performed showing proximal LAD 40%, mid LAD 45%, proximal to mid RCA 25%, RPDA lesion 30%, previous placed ramus stent widely patent.  Normal LVEF 55 to 65%.  At clinic visit yesterday with the pharmacist he was recommended continue his rosuvastatin 20 mg daily as his neck pain is overall not bothersome.  He was recommended to continue his fenofibrate and make  lifestyle changes that he did not anticipate he will be able to afford Vascepa given $100/month or $200/90 day supply cost with his insurance.  If he had recurrent neck pain plan was to reduce dose of rosuvastatin and start Zetia.  He presents today for follow-up. Tells me his neck pain is overall not bothersome at this time. He has very minor pain at the base of his neck but improves with mobility. We discussed neck stretching exercises. He stays his grandson just turned 16 months and he cares fo rhim during the day. This keeps him very busy. Reports no shortness of breath nor dyspnea on exertion. Reports no chest pain, pressure, or tightness. No edema, orthopnea, PND. Reports no  palpitations.     EKGs/Labs/Other Studies Reviewed:   The following studies were reviewed today:  CARDIAC CATH: 08/27/2020     Prox LAD lesion is 40% stenosed.   Mid LAD lesion is 45% stenosed.   Prox RCA to Mid RCA lesion is 25% stenosed.   RPDA lesion is 30% stenosed.   Previously placed Ramus stent (unknown type) is  widely patent.   The left ventricular systolic function is normal.   LV end diastolic pressure is normal.   The left ventricular ejection fraction is 55-65% by visual estimate.   There is no aortic valve stenosis.  Diagnostic Dominance: Right      EKG:  No EKG today   Recent Labs: 08/27/2020: ALT 22; BUN 18; Creatinine, Ser 1.07; Hemoglobin 12.7; Platelets 219; Potassium 3.9; Sodium 137  Recent Lipid Panel    Component Value Date/Time   CHOL 92 08/27/2020 0500   CHOL 103 06/10/2016 0731   TRIG 186 (H) 08/27/2020 0500   HDL 31 (L) 08/27/2020 0500   HDL 33 (L) 06/10/2016 0731   CHOLHDL 3.0 08/27/2020 0500   VLDL 37 08/27/2020 0500   LDLCALC 24 08/27/2020 0500   LDLCALC 26 06/10/2016 0731    Home Medications   Current Meds  Medication Sig   APRISO 0.375 g 24 hr capsule Take 1,500 mg by mouth in the morning.   aspirin EC 81 MG tablet Take 1 tablet (81 mg total) by mouth every Monday, Wednesday, and Friday. Swallow whole. (Patient taking differently: Take 81 mg by mouth daily. Swallow whole.)   Cholecalciferol (VITAMIN D) 50 MCG (2000 UT) CAPS Take 1 capsule by mouth daily.   fenofibrate (TRICOR) 145 MG tablet Take 145 mg by mouth daily.   mercaptopurine (PURINETHOL) 50 MG tablet Take 1 tablet by mouth daily.    metFORMIN (GLUCOPHAGE) 500 MG tablet Take 500-1,000 mg by mouth See admin instructions. Take 1 tablet (500 mg) in the morning and Take 2 tablets (1000 mg) in the evening   nitroGLYCERIN (NITROSTAT) 0.4 MG SL tablet Place 1 tablet (0.4 mg total) under the tongue every 5 (five) minutes as needed for chest pain (x 3 doses).   Omega-3 1000 MG CAPS Take  1 capsule by mouth 2 (two) times daily.    rosuvastatin (CRESTOR) 20 MG tablet TAKE ONE TABLET BY MOUTH ONE TIME DAILY   valsartan-hydrochlorothiazide (DIOVAN-HCT) 320-12.5 MG tablet TAKE ONE TABLET BY MOUTH ONE TIME DAILY   Zinc 50 MG TABS Take 1 tablet by mouth 3 (three) times a week. MWF     Review of Systems     All other systems reviewed and are otherwise negative except as noted above.  Physical Exam    VS:  BP 118/72 (BP Location: Left Arm, Patient Position: Sitting, Cuff Size:  Normal)   Pulse 60   Ht '5\' 11"'$  (1.803 m)   Wt 222 lb 9.6 oz (101 kg)   SpO2 96%   BMI 31.05 kg/m  , BMI Body mass index is 31.05 kg/m.  Wt Readings from Last 3 Encounters:  09/18/20 222 lb 9.6 oz (101 kg)  08/26/20 218 lb (98.9 kg)  01/02/20 219 lb (99.3 kg)    GEN: Well nourished, well developed, in no acute distress. HEENT: normal. Neck: Supple, no JVD, carotid bruits, or masses. Cardiac: RRR, no murmurs, rubs, or gallops. No clubbing, cyanosis, edema.  Radials/PT 2+ and equal bilaterally.  Respiratory:  Respirations regular and unlabored, clear to auscultation bilaterally. GI: Soft, nontender, nondistended. MS: No deformity or atrophy. Skin: Warm and dry, no rash. R radial cath site with no hematoma onr ecchymosis.  Neuro:  Strength and sensation are intact. Psych: Normal affect.  Assessment & Plan    CAD -recent cath with no obstructive disease and patent ramus stent. R radial cath site healing well with no hematoma nor ecchymosis. Stable with no anginal symptoms. No indication for ischemic evaluation.  GDMT includes fenofibrate, aspirin, crestor. Heart healthy diet and regular cardiovascular exercise encouraged.    HLD, LDL goal <55 - Continue fenofibrate and Rosuvastatin. Following with pharmacy team. He has minimal neck discomfort and is agreeable to continue Rosuvastatin. If neck pain worsens, plan to reduce ROsuvastatin and add Zetia per pharmacy team. Repeat lipid panel already  scheduled for 11/2020 to reassess triglycerides. Vascepa presently cost prohibitive.  OSA - CPAP compliance encouraged.   DM2 - Continue to follow with PCP.   HTN - BP well controlled. Continue current antihypertensive regimen.    Disposition: Follow up in 6 month(s) with Dr. Tamala Julian or APP.  Signed, Loel Dubonnet, NP 09/18/2020, 8:36 AM [place]

## 2020-09-17 NOTE — Patient Instructions (Signed)
Nice to see you today!  Keep up the good work with diet and exercise. Aim for a diet full of vegetables, fruit and lean meats (chicken, [place], fish). Try to limit carbs (bread, pasta, sugar, rice) and red meat consumption.  Your goal LDL is less than 55 mg/dL, you're currently at 24 mg/dL. Your goal triglycerides is less than 150 mg/dL. You're currently at 186.   Medication Changes: Continue rosuvastatin 20 mg daily, fenofibrate 145 mg daily, and your over the counter fish oil.   If neck pain increases or is intolerable, call me and we can talk about decreasing your rosuvastatin and adding ezetimibe to see if that helps decrease neck pain while keeping your cholesterol controlled.   Work on increasing exercise and making the small improvements in diet we talked about today to help your triglycerides. The name of the medication we talked about today was Vascepa, which is the prescription strength fish oil. This is $100 for a 30 day supply or $200 for a 90 day supply with your insurance. If your triglycerides remain elevated we can discuss Vascepa at that time.   Lab visit: October 18th. Come fasting to this appointment.    Please give [place] a call at [phone] with any questions or concerns.

## 2020-09-18 ENCOUNTER — Ambulatory Visit (HOSPITAL_BASED_OUTPATIENT_CLINIC_OR_DEPARTMENT_OTHER): Payer: PPO | Admitting: Family

## 2020-09-18 ENCOUNTER — Encounter (HOSPITAL_BASED_OUTPATIENT_CLINIC_OR_DEPARTMENT_OTHER): Payer: Self-pay | Admitting: Family

## 2020-09-18 VITALS — BP 118/72 | HR 60 | Ht 71.0 in | Wt 222.6 lb

## 2020-09-18 DIAGNOSIS — I25118 Atherosclerotic heart disease of native coronary artery with other forms of angina pectoris: Secondary | ICD-10-CM | POA: Diagnosis not present

## 2020-09-18 DIAGNOSIS — E118 Type 2 diabetes mellitus with unspecified complications: Secondary | ICD-10-CM | POA: Diagnosis not present

## 2020-09-18 DIAGNOSIS — I10 Essential (primary) hypertension: Secondary | ICD-10-CM | POA: Diagnosis not present

## 2020-09-18 DIAGNOSIS — G4730 Sleep apnea, unspecified: Secondary | ICD-10-CM

## 2020-09-18 DIAGNOSIS — E782 Mixed hyperlipidemia: Secondary | ICD-10-CM

## 2020-09-18 NOTE — Patient Instructions (Addendum)
Medication Instructions:  Continue your current medications.   *If you need a refill on your cardiac medications before your next appointment, please call your pharmacy*   Lab Work: Cholesterol lab work in October as scheduled.  Testing/Procedures: None ordered today.    Follow-Up: At [facility], you and your health needs are our priority.  As part of our continuing mission to provide you with exceptional heart care, we have created designated Provider Care Teams.  These Care Teams include your primary Cardiologist (physician) and Advanced Practice Providers (APPs -  Physician Assistants and Nurse Practitioners) who all work together to provide you with the care you need, when you need it.  We recommend signing up for the patient portal called "MyChart".  Sign up information is provided on this After Visit Summary.  MyChart is used to connect with patients for Virtual Visits (Telemedicine).  Patients are able to view lab/test results, encounter notes, upcoming appointments, etc.  Non-urgent messages can be sent to your provider as well.   To learn more about what you can do with MyChart, go to [email or web address].    Your next appointment:   6 month(s)  The format for your next appointment:   In Person  Provider:   You may see Sinclair Grooms, MD or one of the following Advanced Practice Providers on your designated Care Team:   Cecilie Kicks, NP   Other Instructions  Heart Healthy Diet Recommendations: A low-salt diet is recommended. Meats should be grilled, baked, or boiled. Avoid fried foods. Focus on lean protein sources like fish or chicken with vegetables and fruits. The American Heart Association is a Microbiologist!   Exercise recommendations: The American Heart Association recommends 150 minutes of moderate intensity exercise weekly. Try 30 minutes of moderate intensity exercise 4-5 times per week. This could include walking, jogging, or swimming.

## 2020-09-20 DIAGNOSIS — G8929 Other chronic pain: Secondary | ICD-10-CM | POA: Diagnosis not present

## 2020-09-20 DIAGNOSIS — I10 Essential (primary) hypertension: Secondary | ICD-10-CM | POA: Diagnosis not present

## 2020-09-20 DIAGNOSIS — E782 Mixed hyperlipidemia: Secondary | ICD-10-CM | POA: Diagnosis not present

## 2020-09-20 DIAGNOSIS — E119 Type 2 diabetes mellitus without complications: Secondary | ICD-10-CM | POA: Diagnosis not present

## 2020-09-20 DIAGNOSIS — I2510 Atherosclerotic heart disease of native coronary artery without angina pectoris: Secondary | ICD-10-CM | POA: Diagnosis not present

## 2020-09-20 DIAGNOSIS — I25118 Atherosclerotic heart disease of native coronary artery with other forms of angina pectoris: Secondary | ICD-10-CM | POA: Diagnosis not present

## 2020-09-20 DIAGNOSIS — N183 Chronic kidney disease, stage 3 unspecified: Secondary | ICD-10-CM | POA: Diagnosis not present

## 2020-09-20 DIAGNOSIS — H35033 Hypertensive retinopathy, bilateral: Secondary | ICD-10-CM | POA: Diagnosis not present

## 2020-10-24 DIAGNOSIS — H04321 Acute dacryocystitis of right lacrimal passage: Secondary | ICD-10-CM | POA: Diagnosis not present

## 2020-10-25 DIAGNOSIS — K289 Gastrojejunal ulcer, unspecified as acute or chronic, without hemorrhage or perforation: Secondary | ICD-10-CM | POA: Diagnosis not present

## 2020-10-25 DIAGNOSIS — Z980 Intestinal bypass and anastomosis status: Secondary | ICD-10-CM | POA: Diagnosis not present

## 2020-10-25 DIAGNOSIS — K2289 Other specified disease of esophagus: Secondary | ICD-10-CM | POA: Diagnosis not present

## 2020-10-25 DIAGNOSIS — K2281 Esophageal polyp: Secondary | ICD-10-CM | POA: Diagnosis not present

## 2020-10-25 DIAGNOSIS — K5289 Other specified noninfective gastroenteritis and colitis: Secondary | ICD-10-CM | POA: Diagnosis not present

## 2020-10-25 DIAGNOSIS — K2930 Chronic superficial gastritis without bleeding: Secondary | ICD-10-CM | POA: Diagnosis not present

## 2020-10-25 DIAGNOSIS — K648 Other hemorrhoids: Secondary | ICD-10-CM | POA: Diagnosis not present

## 2020-10-25 DIAGNOSIS — K5090 Crohn's disease, unspecified, without complications: Secondary | ICD-10-CM | POA: Diagnosis not present

## 2020-10-25 DIAGNOSIS — K621 Rectal polyp: Secondary | ICD-10-CM | POA: Diagnosis not present

## 2020-10-25 DIAGNOSIS — K2980 Duodenitis without bleeding: Secondary | ICD-10-CM | POA: Diagnosis not present

## 2020-10-25 DIAGNOSIS — K317 Polyp of stomach and duodenum: Secondary | ICD-10-CM | POA: Diagnosis not present

## 2020-10-25 DIAGNOSIS — Z1211 Encounter for screening for malignant neoplasm of colon: Secondary | ICD-10-CM | POA: Diagnosis not present

## 2020-10-25 DIAGNOSIS — R1013 Epigastric pain: Secondary | ICD-10-CM | POA: Diagnosis not present

## 2020-11-05 DIAGNOSIS — K2930 Chronic superficial gastritis without bleeding: Secondary | ICD-10-CM | POA: Diagnosis not present

## 2020-11-05 DIAGNOSIS — K5289 Other specified noninfective gastroenteritis and colitis: Secondary | ICD-10-CM | POA: Diagnosis not present

## 2020-11-05 DIAGNOSIS — K621 Rectal polyp: Secondary | ICD-10-CM | POA: Diagnosis not present

## 2020-11-05 DIAGNOSIS — K317 Polyp of stomach and duodenum: Secondary | ICD-10-CM | POA: Diagnosis not present

## 2020-11-05 DIAGNOSIS — K2289 Other specified disease of esophagus: Secondary | ICD-10-CM | POA: Diagnosis not present

## 2020-11-19 ENCOUNTER — Other Ambulatory Visit: Payer: PPO | Admitting: *Deleted

## 2020-11-19 ENCOUNTER — Other Ambulatory Visit: Payer: Self-pay

## 2020-11-19 DIAGNOSIS — E785 Hyperlipidemia, unspecified: Secondary | ICD-10-CM | POA: Diagnosis not present

## 2020-11-19 LAB — LIPID PANEL
Chol/HDL Ratio: 2.6 ratio (ref 0.0–5.0)
Cholesterol, Total: 89 mg/dL — ABNORMAL LOW (ref 100–199)
HDL: 34 mg/dL — ABNORMAL LOW (ref >39)
LDL Chol Calc (NIH): 30 mg/dL (ref 0–99)
Triglycerides: 147 mg/dL (ref 0–149)
VLDL Cholesterol Cal: 25 mg/dL (ref 5–40)

## 2020-11-19 LAB — HEPATIC FUNCTION PANEL
ALT: 19 IU/L (ref 0–44)
AST: 22 IU/L (ref 0–40)
Albumin: 4.4 g/dL (ref 3.8–4.8)
Alkaline Phosphatase: 56 IU/L (ref 44–121)
Bilirubin Total: 0.3 mg/dL (ref 0.0–1.2)
Bilirubin, Direct: 0.11 mg/dL (ref 0.00–0.40)
Total Protein: 7.1 g/dL (ref 6.0–8.5)

## 2020-12-02 DIAGNOSIS — E119 Type 2 diabetes mellitus without complications: Secondary | ICD-10-CM | POA: Diagnosis not present

## 2020-12-18 ENCOUNTER — Ambulatory Visit: Payer: PPO | Admitting: Dermatology

## 2020-12-18 ENCOUNTER — Other Ambulatory Visit: Payer: Self-pay

## 2020-12-18 ENCOUNTER — Encounter: Payer: Self-pay | Admitting: Dermatology

## 2020-12-18 DIAGNOSIS — L570 Actinic keratosis: Secondary | ICD-10-CM

## 2020-12-18 DIAGNOSIS — D485 Neoplasm of uncertain behavior of skin: Secondary | ICD-10-CM

## 2020-12-18 NOTE — Patient Instructions (Signed)

## 2020-12-30 ENCOUNTER — Telehealth: Payer: Self-pay

## 2020-12-30 NOTE — Telephone Encounter (Signed)
Phone call to patient with his pathology results. Patient aware of results.  

## 2020-12-30 NOTE — Telephone Encounter (Signed)
-----   Message from Lavonna Monarch, MD sent at 12/20/2020 10:02 PM EST ----- Let him know that the biopsy showed a thick precancer which may be cured with the biopsy.  If there is any residual present in 1 to 2 months, he could come in for liquid nitrogen freeze.

## 2021-01-01 DIAGNOSIS — E119 Type 2 diabetes mellitus without complications: Secondary | ICD-10-CM | POA: Diagnosis not present

## 2021-01-10 DIAGNOSIS — H2513 Age-related nuclear cataract, bilateral: Secondary | ICD-10-CM | POA: Diagnosis not present

## 2021-01-10 DIAGNOSIS — H35013 Changes in retinal vascular appearance, bilateral: Secondary | ICD-10-CM | POA: Diagnosis not present

## 2021-01-10 DIAGNOSIS — H524 Presbyopia: Secondary | ICD-10-CM | POA: Diagnosis not present

## 2021-01-10 DIAGNOSIS — H40013 Open angle with borderline findings, low risk, bilateral: Secondary | ICD-10-CM | POA: Diagnosis not present

## 2021-01-10 DIAGNOSIS — E119 Type 2 diabetes mellitus without complications: Secondary | ICD-10-CM | POA: Diagnosis not present

## 2021-01-13 ENCOUNTER — Encounter: Payer: Self-pay | Admitting: Dermatology

## 2021-01-13 NOTE — Progress Notes (Signed)
   Follow-Up Visit   Subjective  Jesus Stone is a 65 y.o. male who presents for the following: Follow-up (Pdt- scalp and face- good reaction no concerns ).  PDT follow-up, several crusts on scalp and face to check Location:  Duration:  Quality:  Associated Signs/Symptoms: Modifying Factors:  Severity:  Timing: Context:   Objective  Well appearing patient in no apparent distress; mood and affect are within normal limits. Left Anterior Mandible 1 spot left jawline do not clear with PDT and in fact is growing.  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 Relevant physical exam findings are noted in the Assessment and Plan.   Assessment & Plan    Neoplasm of uncertain behavior of skin Left Anterior Mandible  Skin / nail biopsy Type of biopsy: tangential   Informed consent: discussed and consent obtained   Timeout: patient name, date of birth, surgical site, and procedure verified   Procedure prep:  Patient was prepped and draped in usual sterile fashion (Non sterile) Prep type:  Chlorhexidine Anesthesia: the lesion was anesthetized in a standard fashion   Anesthetic:  1% lidocaine w/ epinephrine 1-100,000 local infiltration Instrument used: flexible razor blade   Outcome: patient tolerated procedure well   Post-procedure details: wound care instructions given    Specimen 1 - Surgical pathology Differential Diagnosis: bcc vs scc  Check Margins: No  AK (actinic keratosis) (6) Mid Parietal Scalp  Destruction of lesion - Mid Parietal Scalp Complexity: simple   Destruction method: cryotherapy   Informed consent: discussed and consent obtained   Timeout:  patient name, date of birth, surgical site, and procedure verified Lesion destroyed using liquid nitrogen: Yes   Cryotherapy cycles:   3 Outcome: patient tolerated procedure well with no complications        I, Lavonna Monarch, MD, have reviewed all documentation for this visit.  The documentation on 01/13/21 for the exam, diagnosis, procedures, and orders are all accurate and complete.

## 2021-01-15 DIAGNOSIS — H40013 Open angle with borderline findings, low risk, bilateral: Secondary | ICD-10-CM | POA: Diagnosis not present

## 2021-01-28 DIAGNOSIS — K259 Gastric ulcer, unspecified as acute or chronic, without hemorrhage or perforation: Secondary | ICD-10-CM | POA: Diagnosis not present

## 2021-01-29 ENCOUNTER — Encounter: Payer: Self-pay | Admitting: Interventional Cardiology

## 2021-01-30 DIAGNOSIS — E782 Mixed hyperlipidemia: Secondary | ICD-10-CM | POA: Diagnosis not present

## 2021-01-30 DIAGNOSIS — E119 Type 2 diabetes mellitus without complications: Secondary | ICD-10-CM | POA: Diagnosis not present

## 2021-01-30 DIAGNOSIS — N183 Chronic kidney disease, stage 3 unspecified: Secondary | ICD-10-CM | POA: Diagnosis not present

## 2021-01-30 DIAGNOSIS — H35033 Hypertensive retinopathy, bilateral: Secondary | ICD-10-CM | POA: Diagnosis not present

## 2021-01-30 DIAGNOSIS — I10 Essential (primary) hypertension: Secondary | ICD-10-CM | POA: Diagnosis not present

## 2021-01-30 DIAGNOSIS — G8929 Other chronic pain: Secondary | ICD-10-CM | POA: Diagnosis not present

## 2021-02-11 DIAGNOSIS — Z7984 Long term (current) use of oral hypoglycemic drugs: Secondary | ICD-10-CM | POA: Diagnosis not present

## 2021-02-11 DIAGNOSIS — N183 Chronic kidney disease, stage 3 unspecified: Secondary | ICD-10-CM | POA: Diagnosis not present

## 2021-02-11 DIAGNOSIS — E782 Mixed hyperlipidemia: Secondary | ICD-10-CM | POA: Diagnosis not present

## 2021-02-11 DIAGNOSIS — E538 Deficiency of other specified B group vitamins: Secondary | ICD-10-CM | POA: Diagnosis not present

## 2021-02-11 DIAGNOSIS — I10 Essential (primary) hypertension: Secondary | ICD-10-CM | POA: Diagnosis not present

## 2021-02-11 DIAGNOSIS — K5080 Crohn's disease of both small and large intestine without complications: Secondary | ICD-10-CM | POA: Diagnosis not present

## 2021-02-11 DIAGNOSIS — E1159 Type 2 diabetes mellitus with other circulatory complications: Secondary | ICD-10-CM | POA: Diagnosis not present

## 2021-02-11 DIAGNOSIS — N1831 Chronic kidney disease, stage 3a: Secondary | ICD-10-CM | POA: Diagnosis not present

## 2021-02-11 DIAGNOSIS — I25118 Atherosclerotic heart disease of native coronary artery with other forms of angina pectoris: Secondary | ICD-10-CM | POA: Diagnosis not present

## 2021-02-11 DIAGNOSIS — M109 Gout, unspecified: Secondary | ICD-10-CM | POA: Diagnosis not present

## 2021-02-11 DIAGNOSIS — Z125 Encounter for screening for malignant neoplasm of prostate: Secondary | ICD-10-CM | POA: Diagnosis not present

## 2021-02-11 DIAGNOSIS — E042 Nontoxic multinodular goiter: Secondary | ICD-10-CM | POA: Diagnosis not present

## 2021-02-11 DIAGNOSIS — E1165 Type 2 diabetes mellitus with hyperglycemia: Secondary | ICD-10-CM | POA: Diagnosis not present

## 2021-02-11 DIAGNOSIS — Z0001 Encounter for general adult medical examination with abnormal findings: Secondary | ICD-10-CM | POA: Diagnosis not present

## 2021-02-11 DIAGNOSIS — E559 Vitamin D deficiency, unspecified: Secondary | ICD-10-CM | POA: Diagnosis not present

## 2021-02-17 ENCOUNTER — Other Ambulatory Visit: Payer: Self-pay | Admitting: Interventional Cardiology

## 2021-03-04 DIAGNOSIS — E119 Type 2 diabetes mellitus without complications: Secondary | ICD-10-CM | POA: Diagnosis not present

## 2021-03-11 ENCOUNTER — Other Ambulatory Visit: Payer: Self-pay

## 2021-03-11 ENCOUNTER — Encounter: Payer: Self-pay | Admitting: Dermatology

## 2021-03-11 ENCOUNTER — Ambulatory Visit: Payer: PPO | Admitting: Dermatology

## 2021-03-11 DIAGNOSIS — L820 Inflamed seborrheic keratosis: Secondary | ICD-10-CM | POA: Diagnosis not present

## 2021-03-11 DIAGNOSIS — L918 Other hypertrophic disorders of the skin: Secondary | ICD-10-CM | POA: Diagnosis not present

## 2021-03-11 DIAGNOSIS — D485 Neoplasm of uncertain behavior of skin: Secondary | ICD-10-CM

## 2021-03-11 NOTE — Patient Instructions (Signed)

## 2021-03-22 ENCOUNTER — Other Ambulatory Visit: Payer: Self-pay | Admitting: Interventional Cardiology

## 2021-03-23 NOTE — Progress Notes (Signed)
Cardiology Office Note:    Date:  03/24/2021   ID:  Gerritt, Galentine [birth year], MRN [id number]  PCP:  Josetta Huddle, MD  Cardiologist:  Sinclair Grooms, MD   Referring MD: Josetta Huddle, MD   Chief Complaint  Patient presents with   Follow-up    CAD Hyperlipidemia Diabetes mellitus type 2    History of Present Illness:    Jesus Stone is a 66 y.o. male with a hx of CAD (BMS to ramus in 2000, DES in 2017 for ISR ramus), hypertension, hyperlipidemia, DM2, OSA, TIA, obesity  presents today for hospital follow up.    He has concerns about how he is doing overall.  He denies angina pectoris.  He is trying to tackle his diabetes problem.  He is on a keto diet and is fasting 18 hours a day.  I informed him he should speak with Dr. Inda Merlin concerning this and potentially have an endocrine appointment/consultation.  He is walking about 5 miles per day.  His weight is coming down.  He is not having angina.  He denies excessive dyspnea.  Past Medical History:  Diagnosis Date   Acute sinusitis, unspecified    Anal polyp    Bell's palsy    CAD (coronary artery disease)    a. BMSx2 to ramus 2000  b. 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   Crohn's disease ([place])    Diabetes mellitus, type 2 (HCC)    Gout, unspecified    HLD (hyperlipidemia)    HTN (hypertension)    Nontoxic multinodular goiter    Obesity    Periodic limb movement disorder    Sleep apnea    Squamous cell carcinoma of skin 04/04/2014   well diff-right crown (CX35FU)    Squamous cell carcinoma of skin 11/08/2018   left medial scalp (MOHS)   Squamous cell carcinoma of skin 11/08/2018   KA-left preauricular- [place]   Thyroid nodule    TIA (transient ischemic attack)    Unspecified vitamin D deficiency     Past Surgical History:  Procedure Laterality Date   APPENDECTOMY  1964   CARDIAC CATHETERIZATION N/A 04/26/2015   Procedure: Left Heart Cath and  Coronary Angiography;  Surgeon: Peter M Martinique, MD;  Location: [place] [facility];  Service: Cardiovascular;  Laterality: N/A;   CARDIAC CATHETERIZATION N/A 04/26/2015   Procedure: Intravascular Pressure Wire/FFR Study;  Surgeon: Peter M Martinique, MD;  Location: [place] [facility];  Service: Cardiovascular;  Laterality: N/A;   CARDIAC CATHETERIZATION N/A 04/26/2015   Procedure: Coronary Stent Intervention;  Surgeon: Peter M Martinique, MD;  Location: [place] [facility];  Service: Cardiovascular;  Laterality: N/A;   Coronary artery stent placment  03/1998   HEEL SPUR EXCISION Right 2008   LEFT HEART CATH AND CORONARY ANGIOGRAPHY N/A 08/27/2020   Procedure: LEFT HEART CATH AND CORONARY ANGIOGRAPHY;  Surgeon: Leonie Man, MD;  Location: [place] [facility];  Service: Cardiovascular;  Laterality: N/A;   RECTAL POLYPECTOMY  2008, 2009   TESTICLE SURGERY  1967    Current Medications: Current Meds  Medication Sig   amLODipine (NORVASC) 5 MG tablet Take 5 mg by mouth daily.   APRISO 0.375 g 24 hr capsule Take 1,500 mg by mouth in the morning.   aspirin EC 81 MG tablet Take 1 tablet (81 mg total) by mouth every Monday, Wednesday, and Friday. Swallow whole. (Patient taking differently: Take  81 mg by mouth daily. Swallow whole.)   Black Pepper-Turmeric (TURMERIC COMPLEX/BLACK PEPPER) 3-500 MG CAPS Take 2,000 mcg by mouth daily.   Cholecalciferol (VITAMIN D) 50 MCG (2000 UT) CAPS Take 1 capsule by mouth daily.   fenofibrate (TRICOR) 145 MG tablet Take 145 mg by mouth daily.   Magnesium Glycinate 100 MG CAPS Take 1 capsule by mouth daily.   mercaptopurine (PURINETHOL) 50 MG tablet Take 1 tablet by mouth daily.    metFORMIN (GLUCOPHAGE) 500 MG tablet Take 500-1,000 mg by mouth See admin instructions. Take 1 tablet (500 mg) in the morning and Take 2 tablets (1000 mg) in the evening   nitroGLYCERIN (NITROSTAT) 0.4 MG SL tablet Place 1 tablet (0.4 mg total) under the tongue every 5 (five) minutes as needed for  chest pain (x 3 doses).   Omega-3 1000 MG CAPS Take 1 capsule by mouth 2 (two) times daily.    omeprazole (PRILOSEC) 40 MG capsule Take 40 mg by mouth daily.   rosuvastatin (CRESTOR) 20 MG tablet TAKE ONE TABLET BY MOUTH ONE TIME DAILY   valsartan-hydrochlorothiazide (DIOVAN-HCT) 320-12.5 MG tablet TAKE ONE TABLET BY MOUTH ONE TIME DAILY   Zinc 50 MG TABS Take 1 tablet by mouth 3 (three) times a week. MWF     Allergies:   Lipitor [atorvastatin] and Simvastatin   Social History   Socioeconomic History   Marital status: Married    Spouse name: sharon   Number of children: 2   Years of education: college   Highest education level: Not on file  Occupational History   Occupation: [employer]    Employer: [employer]  Tobacco Use   Smoking status: Former    Types: Cigarettes    Quit date: 03/01/1984    Years since quitting: 37.0   Smokeless tobacco: Never  Substance and Sexual Activity   Alcohol use: No    Alcohol/week: 0.0 standard drinks   Drug use: No   Sexual activity: Yes  Other Topics Concern   Not on file  Social History Narrative   Right handed male   Social Determinants of Health   Financial Resource Strain: Not on file  Food Insecurity: Not on file  Transportation Needs: Not on file  Physical Activity: Not on file  Stress: Not on file  Social Connections: Not on file     Family History: The patient's family history includes Cancer in his maternal grandmother, mother, and paternal aunt; Diabetes in his sister.  ROS:   Please see the history of present illness.    Multiple questions concerning his diet, triglycerides, fasting, and the safety of a keto diet.  I referred him back to primary care and possibly an endocrine consultation.  All other systems reviewed and are negative.  EKGs/Labs/Other Studies Reviewed:    The following studies were reviewed today:  CARDIAC CATH: 08/27/2020   Prox LAD lesion is 40% stenosed.   Mid LAD lesion is 45% stenosed.   Prox  RCA to Mid RCA lesion is 25% stenosed.   RPDA lesion is 30% stenosed.   Previously placed Ramus stent (unknown type) is  widely patent.   The left ventricular systolic function is normal.   LV end diastolic pressure is normal.   The left ventricular ejection fraction is 55-65% by visual estimate.   There is no aortic valve stenosis.  Diagnostic Dominance: Right     EKG:  EKG not repeated  Recent Labs: 08/27/2020: BUN 18; Creatinine, Ser 1.07; Hemoglobin 12.7; Platelets 219;  Potassium 3.9; Sodium 137 11/19/2020: ALT 19  Recent Lipid Panel    Component Value Date/Time   CHOL 89 (L) 11/19/2020 0745   TRIG 147 11/19/2020 0745   HDL 34 (L) 11/19/2020 0745   CHOLHDL 2.6 11/19/2020 0745   CHOLHDL 3.0 08/27/2020 0500   VLDL 37 08/27/2020 0500   LDLCALC 30 11/19/2020 0745    Physical Exam:    VS:  BP 110/62    Pulse 63    Ht 5\' 11"  (1.803 m)    Wt 213 lb (96.6 kg)    SpO2 98%    BMI 29.71 kg/m     Wt Readings from Last 3 Encounters:  03/24/21 213 lb (96.6 kg)  09/18/20 222 lb 9.6 oz (101 kg)  08/26/20 218 lb (98.9 kg)     GEN: Weight is down nearly 10 pounds since August.  Still overweight.. No acute distress HEENT: Normal NECK: No JVD. LYMPHATICS: No lymphadenopathy CARDIAC: No murmur. RRR no gallop, or edema. VASCULAR:  Normal Pulses. No bruits. RESPIRATORY:  Clear to auscultation without rales, wheezing or rhonchi  ABDOMEN: Soft, non-tender, non-distended, No pulsatile mass, MUSCULOSKELETAL: No deformity  SKIN: Warm and dry NEUROLOGIC:  Alert and oriented x 3 PSYCHIATRIC:  Normal affect   ASSESSMENT:    1. Coronary artery disease of native artery of native heart with stable angina pectoris ([place])   2. Mixed hyperlipidemia   3. Sleep apnea, unspecified type   4. Essential hypertension   5. Type 2 diabetes mellitus with complication, without long-term current use of insulin (HCC)    PLAN:    In order of problems listed above:  Secondary prevention discussed.   See below. Continue Tricor and Crestor.  We did discuss that omega-3 fatty acids and fenofibrate lowering of triglyceride has no data to suggest long-term benefit.  Vascepa would be the alternative management strategy for triglycerides and has data.  It ends up being too expensive for the patient to take. Encouraged CPAP if recommended. Excellent blood pressure control currently. Consider adding or substituting SGLT2 therapy for sugar control and gain the CV benefit from the medication class.   Overall education and awareness concerning primary/secondary risk prevention was discussed in detail: LDL less than 70, hemoglobin A1c less than 7, blood pressure target less than 130/80 mmHg, >150 minutes of moderate aerobic activity per week, avoidance of smoking, weight control (via diet and exercise), and continued surveillance/management of/for obstructive sleep apnea.   Medication Adjustments/Labs and Tests Ordered: Current medicines are reviewed at length with the patient today.  Concerns regarding medicines are outlined above.  No orders of the defined types were placed in this encounter.  No orders of the defined types were placed in this encounter.   Patient Instructions  Medication Instructions:  Your physician recommends that you continue on your current medications as directed. Please refer to the Current Medication list given to you today.  *If you need a refill on your cardiac medications before your next appointment, please call your pharmacy*   Lab Work: NONE If you have labs (blood work) drawn today and your tests are completely normal, you will receive your results only by: [place] (if you have MyChart) OR A paper copy in the mail If you have any lab test that is abnormal or we need to change your treatment, we will call you to review the results.   Testing/Procedures: NONE   Follow-Up: At [facility], you and your health needs are our priority.  As part of  our continuing mission to provide you with exceptional heart care, we have created designated Provider Care Teams.  These Care Teams include your primary Cardiologist (physician) and Advanced Practice Providers (APPs -  Physician Assistants and Nurse Practitioners) who all work together to provide you with the care you need, when you need it.  We recommend signing up for the patient portal called "MyChart".  Sign up information is provided on this After Visit Summary.  MyChart is used to connect with patients for Virtual Visits (Telemedicine).  Patients are able to view lab/test results, encounter notes, upcoming appointments, etc.  Non-urgent messages can be sent to your provider as well.   To learn more about what you can do with MyChart, go to [email or web address].    Your next appointment:   1 year(s)  The format for your next appointment:   In Person  Provider:   Sinclair Grooms, MD     Other Instructions Speak with your primary care provider about starting Farxiga or Jardiance.   Signed, Sinclair Grooms, MD  03/24/2021 8:12 AM    [place]

## 2021-03-24 ENCOUNTER — Encounter: Payer: Self-pay | Admitting: Interventional Cardiology

## 2021-03-24 ENCOUNTER — Ambulatory Visit: Payer: PPO | Admitting: Interventional Cardiology

## 2021-03-24 ENCOUNTER — Other Ambulatory Visit: Payer: Self-pay

## 2021-03-24 VITALS — BP 110/62 | HR 63 | Ht 71.0 in | Wt 213.0 lb

## 2021-03-24 DIAGNOSIS — I25118 Atherosclerotic heart disease of native coronary artery with other forms of angina pectoris: Secondary | ICD-10-CM | POA: Diagnosis not present

## 2021-03-24 DIAGNOSIS — E118 Type 2 diabetes mellitus with unspecified complications: Secondary | ICD-10-CM

## 2021-03-24 DIAGNOSIS — G4730 Sleep apnea, unspecified: Secondary | ICD-10-CM | POA: Diagnosis not present

## 2021-03-24 DIAGNOSIS — E782 Mixed hyperlipidemia: Secondary | ICD-10-CM

## 2021-03-24 DIAGNOSIS — I10 Essential (primary) hypertension: Secondary | ICD-10-CM

## 2021-03-24 NOTE — Patient Instructions (Signed)
Medication Instructions:  Your physician recommends that you continue on your current medications as directed. Please refer to the Current Medication list given to you today.  *If you need a refill on your cardiac medications before your next appointment, please call your pharmacy*   Lab Work: NONE If you have labs (blood work) drawn today and your tests are completely normal, you will receive your results only by: [place] (if you have MyChart) OR A paper copy in the mail If you have any lab test that is abnormal or we need to change your treatment, we will call you to review the results.   Testing/Procedures: NONE   Follow-Up: At [facility], you and your health needs are our priority.  As part of our continuing mission to provide you with exceptional heart care, we have created designated Provider Care Teams.  These Care Teams include your primary Cardiologist (physician) and Advanced Practice Providers (APPs -  Physician Assistants and Nurse Practitioners) who all work together to provide you with the care you need, when you need it.  We recommend signing up for the patient portal called "MyChart".  Sign up information is provided on this After Visit Summary.  MyChart is used to connect with patients for Virtual Visits (Telemedicine).  Patients are able to view lab/test results, encounter notes, upcoming appointments, etc.  Non-urgent messages can be sent to your provider as well.   To learn more about what you can do with MyChart, go to [email or web address].    Your next appointment:   1 year(s)  The format for your next appointment:   In Person  Provider:   Sinclair Grooms, MD     Other Instructions Speak with your primary care provider about starting Farxiga or Jardiance.

## 2021-03-30 ENCOUNTER — Encounter: Payer: Self-pay | Admitting: Dermatology

## 2021-03-30 NOTE — Progress Notes (Signed)
° °  Follow-Up Visit   Subjective  Jesus Stone is a 66 y.o. male who presents for the following: Skin Problem (Skin tags around neck ).  Enlarging growth neck and patient would like removal of several skin tags. Location:  Duration:  Quality:  Associated Signs/Symptoms: Modifying Factors:  Severity:  Timing: Context:   Objective  Well appearing patient in no apparent distress; mood and affect are within normal limits. Neck - Anterior Inflamed 5 mm pink crust.  I SK over superficial carcinoma     Neck - Anterior Fleshy, skin-colored  pedunculated papules.         All skin waist up examined.   Assessment & Plan    Neoplasm of uncertain behavior of skin Neck - Anterior  Skin / nail biopsy Type of biopsy: tangential   Informed consent: discussed and consent obtained   Timeout: patient name, date of birth, surgical site, and procedure verified   Procedure prep:  Patient was prepped and draped in usual sterile fashion (Non sterile) Prep type:  Chlorhexidine Anesthesia: the lesion was anesthetized in a standard fashion   Anesthetic:  1% lidocaine w/ epinephrine 1-100,000 local infiltration Instrument used: flexible razor blade   Outcome: patient tolerated procedure well   Post-procedure details: wound care instructions given    Specimen 1 - Surgical pathology Differential Diagnosis: r/o tag  Check Margins: No  Skin tags, multiple acquired Neck - Anterior  Scissor removal of multiple skin tags, patient understands this may not be covered by medical insurance  Destruction of lesion - Neck - Anterior Complexity: simple   Destruction method comment:  Scissors were used to snip tag at the base Informed consent: discussed and consent obtained   Timeout:  patient name, date of birth, surgical site, and procedure verified Anesthesia: the lesion was anesthetized in a standard fashion   Hemostasis achieved with:  pressure Outcome: patient tolerated procedure well  with no complications   Post-procedure details: wound care instructions given        I, Lavonna Monarch, MD, have reviewed all documentation for this visit.  The documentation on 03/30/21 for the exam, diagnosis, procedures, and orders are all accurate and complete.

## 2021-04-01 DIAGNOSIS — E1159 Type 2 diabetes mellitus with other circulatory complications: Secondary | ICD-10-CM | POA: Diagnosis not present

## 2021-04-09 DIAGNOSIS — K257 Chronic gastric ulcer without hemorrhage or perforation: Secondary | ICD-10-CM | POA: Diagnosis not present

## 2021-05-02 DIAGNOSIS — E1159 Type 2 diabetes mellitus with other circulatory complications: Secondary | ICD-10-CM | POA: Diagnosis not present

## 2021-05-06 DIAGNOSIS — K5090 Crohn's disease, unspecified, without complications: Secondary | ICD-10-CM | POA: Diagnosis not present

## 2021-06-02 DIAGNOSIS — Z955 Presence of coronary angioplasty implant and graft: Secondary | ICD-10-CM | POA: Diagnosis not present

## 2021-06-02 DIAGNOSIS — K5080 Crohn's disease of both small and large intestine without complications: Secondary | ICD-10-CM | POA: Diagnosis not present

## 2021-06-02 DIAGNOSIS — E782 Mixed hyperlipidemia: Secondary | ICD-10-CM | POA: Diagnosis not present

## 2021-06-02 DIAGNOSIS — N1831 Chronic kidney disease, stage 3a: Secondary | ICD-10-CM | POA: Diagnosis not present

## 2021-06-02 DIAGNOSIS — E042 Nontoxic multinodular goiter: Secondary | ICD-10-CM | POA: Diagnosis not present

## 2021-06-02 DIAGNOSIS — E1159 Type 2 diabetes mellitus with other circulatory complications: Secondary | ICD-10-CM | POA: Diagnosis not present

## 2021-06-02 DIAGNOSIS — M109 Gout, unspecified: Secondary | ICD-10-CM | POA: Diagnosis not present

## 2021-06-02 DIAGNOSIS — E538 Deficiency of other specified B group vitamins: Secondary | ICD-10-CM | POA: Diagnosis not present

## 2021-06-02 DIAGNOSIS — I25118 Atherosclerotic heart disease of native coronary artery with other forms of angina pectoris: Secondary | ICD-10-CM | POA: Diagnosis not present

## 2021-06-02 DIAGNOSIS — I10 Essential (primary) hypertension: Secondary | ICD-10-CM | POA: Diagnosis not present

## 2021-06-02 DIAGNOSIS — Z8673 Personal history of transient ischemic attack (TIA), and cerebral infarction without residual deficits: Secondary | ICD-10-CM | POA: Diagnosis not present

## 2021-06-02 DIAGNOSIS — K5090 Crohn's disease, unspecified, without complications: Secondary | ICD-10-CM | POA: Diagnosis not present

## 2021-07-02 DIAGNOSIS — E119 Type 2 diabetes mellitus without complications: Secondary | ICD-10-CM | POA: Diagnosis not present

## 2021-07-02 DIAGNOSIS — E782 Mixed hyperlipidemia: Secondary | ICD-10-CM | POA: Diagnosis not present

## 2021-07-02 DIAGNOSIS — G8929 Other chronic pain: Secondary | ICD-10-CM | POA: Diagnosis not present

## 2021-07-02 DIAGNOSIS — I10 Essential (primary) hypertension: Secondary | ICD-10-CM | POA: Diagnosis not present

## 2021-07-02 DIAGNOSIS — N183 Chronic kidney disease, stage 3 unspecified: Secondary | ICD-10-CM | POA: Diagnosis not present

## 2021-07-14 DIAGNOSIS — H43393 Other vitreous opacities, bilateral: Secondary | ICD-10-CM | POA: Diagnosis not present

## 2021-07-14 DIAGNOSIS — H43812 Vitreous degeneration, left eye: Secondary | ICD-10-CM | POA: Diagnosis not present

## 2021-07-14 DIAGNOSIS — H40013 Open angle with borderline findings, low risk, bilateral: Secondary | ICD-10-CM | POA: Diagnosis not present

## 2021-08-07 DIAGNOSIS — H43812 Vitreous degeneration, left eye: Secondary | ICD-10-CM | POA: Diagnosis not present

## 2021-08-21 DIAGNOSIS — E782 Mixed hyperlipidemia: Secondary | ICD-10-CM | POA: Diagnosis not present

## 2021-08-21 DIAGNOSIS — N183 Chronic kidney disease, stage 3 unspecified: Secondary | ICD-10-CM | POA: Diagnosis not present

## 2021-08-21 DIAGNOSIS — E119 Type 2 diabetes mellitus without complications: Secondary | ICD-10-CM | POA: Diagnosis not present

## 2021-08-21 DIAGNOSIS — I10 Essential (primary) hypertension: Secondary | ICD-10-CM | POA: Diagnosis not present

## 2021-08-21 DIAGNOSIS — G8929 Other chronic pain: Secondary | ICD-10-CM | POA: Diagnosis not present

## 2021-09-11 DIAGNOSIS — H43812 Vitreous degeneration, left eye: Secondary | ICD-10-CM | POA: Diagnosis not present

## 2021-09-12 DIAGNOSIS — K5090 Crohn's disease, unspecified, without complications: Secondary | ICD-10-CM | POA: Diagnosis not present

## 2021-09-25 DIAGNOSIS — G8929 Other chronic pain: Secondary | ICD-10-CM | POA: Diagnosis not present

## 2021-09-25 DIAGNOSIS — N183 Chronic kidney disease, stage 3 unspecified: Secondary | ICD-10-CM | POA: Diagnosis not present

## 2021-09-25 DIAGNOSIS — I10 Essential (primary) hypertension: Secondary | ICD-10-CM | POA: Diagnosis not present

## 2021-09-25 DIAGNOSIS — E782 Mixed hyperlipidemia: Secondary | ICD-10-CM | POA: Diagnosis not present

## 2021-09-25 DIAGNOSIS — E119 Type 2 diabetes mellitus without complications: Secondary | ICD-10-CM | POA: Diagnosis not present

## 2021-09-29 DIAGNOSIS — M25551 Pain in right hip: Secondary | ICD-10-CM | POA: Diagnosis not present

## 2021-10-02 DIAGNOSIS — M5441 Lumbago with sciatica, right side: Secondary | ICD-10-CM | POA: Diagnosis not present

## 2021-10-02 DIAGNOSIS — M25551 Pain in right hip: Secondary | ICD-10-CM | POA: Diagnosis not present

## 2021-10-02 DIAGNOSIS — M6281 Muscle weakness (generalized): Secondary | ICD-10-CM | POA: Diagnosis not present

## 2021-10-06 IMAGING — CR DG CERVICAL SPINE 2 OR 3 VIEWS
3 series · 3 of 3 positions shown · non-contrast
Comparison: None.

CLINICAL DATA: Neck and shoulder pain

EXAM:
CERVICAL SPINE - 2-3 VIEW

[w c-spine lat]
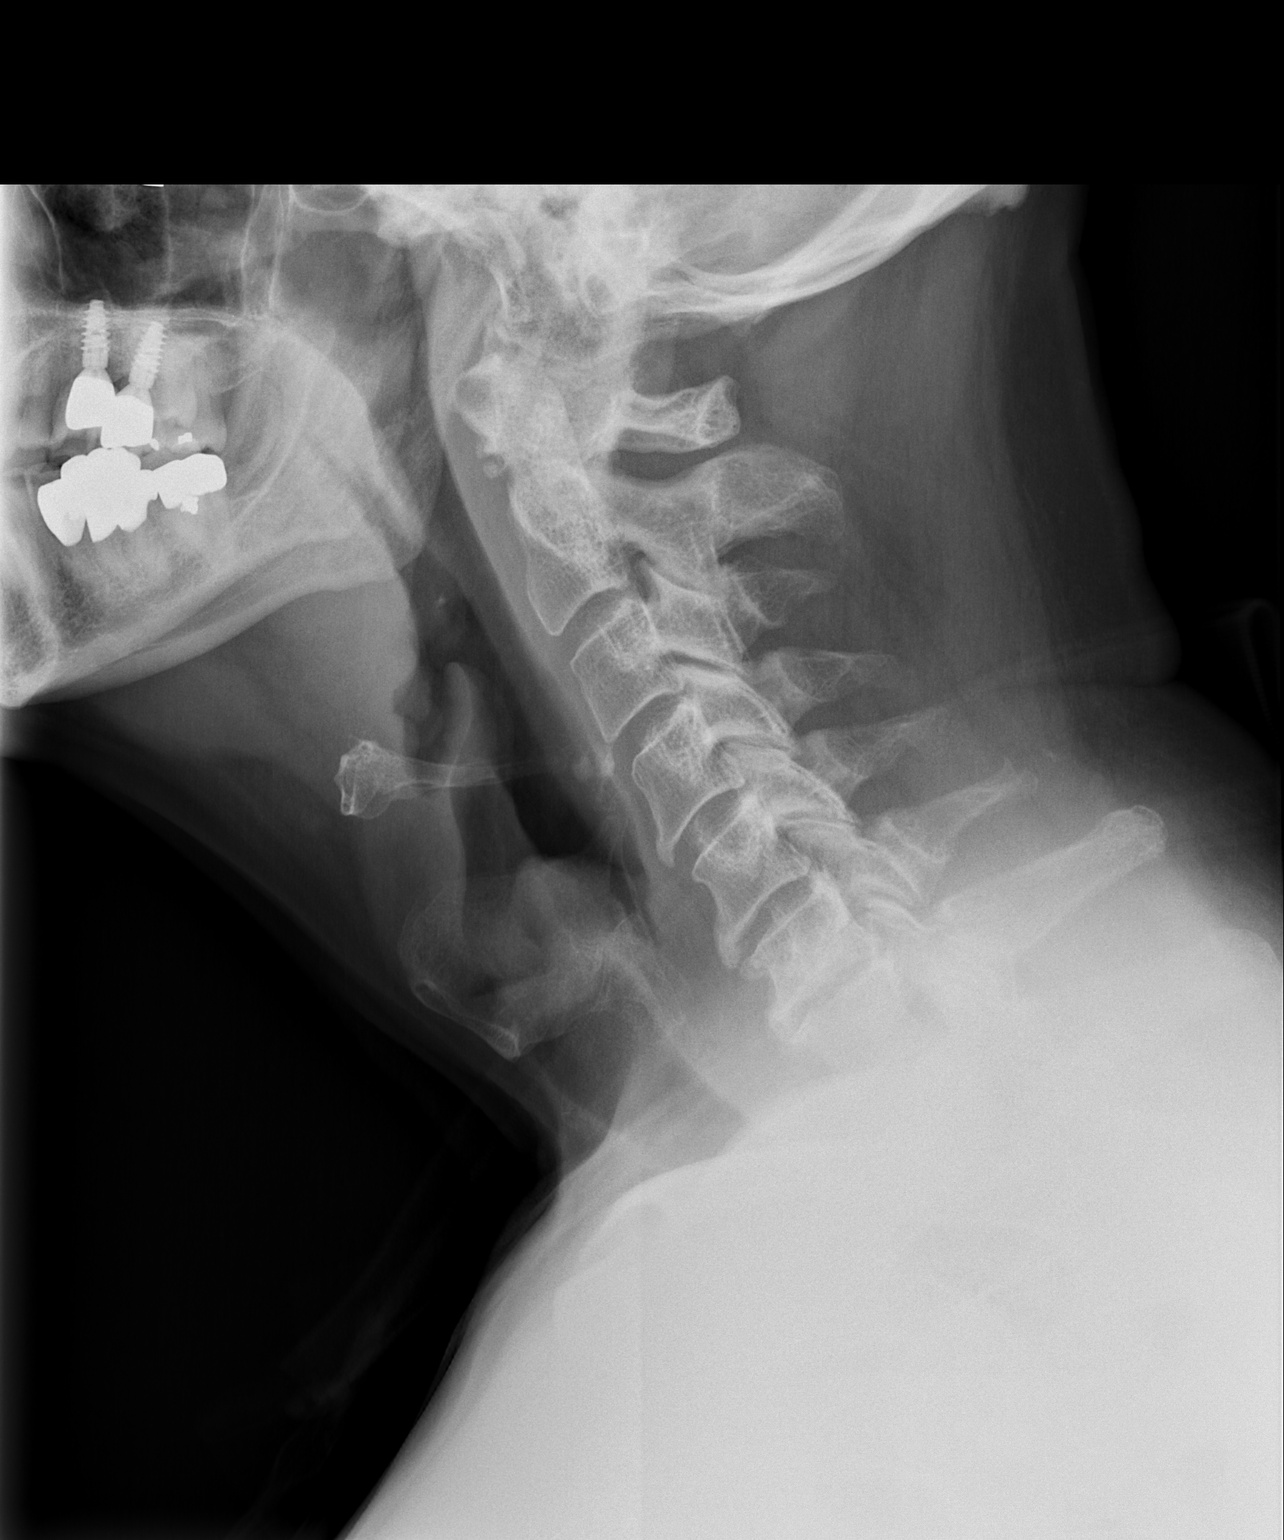

[w c-spine a.p. *]
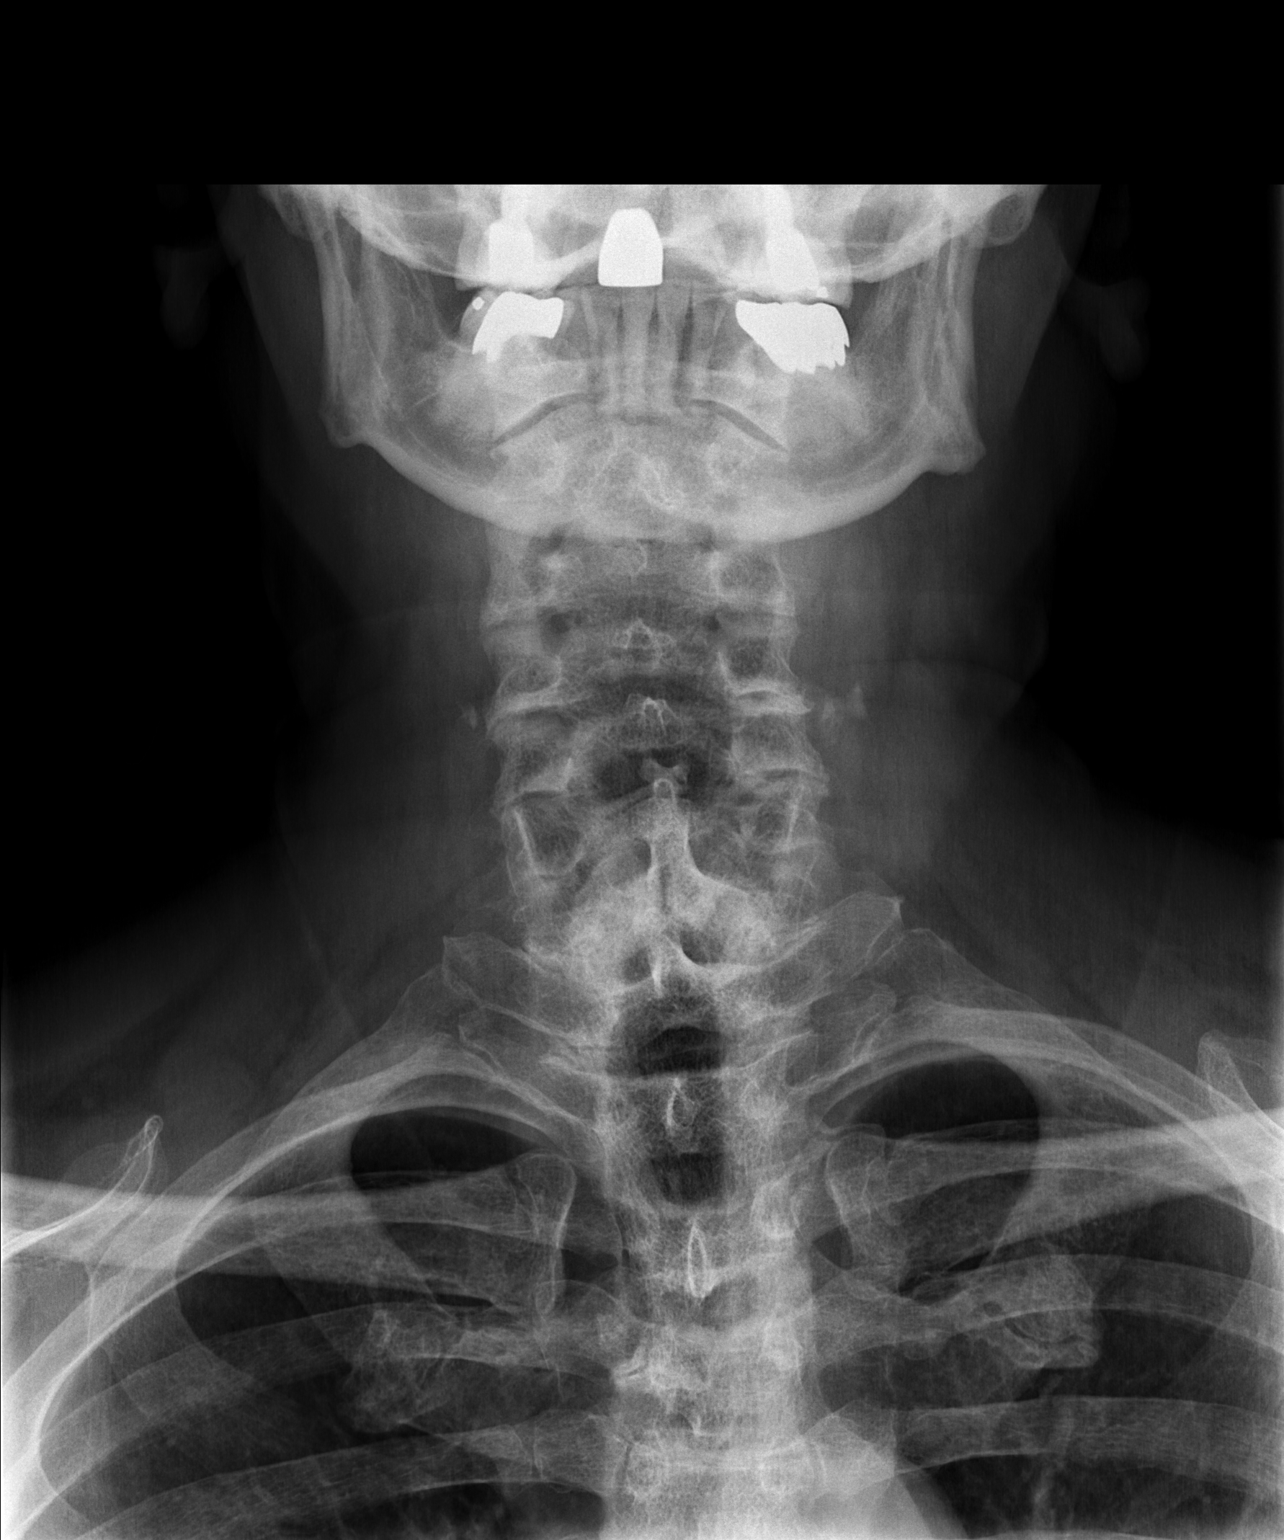

[w c-spine odontoid *]
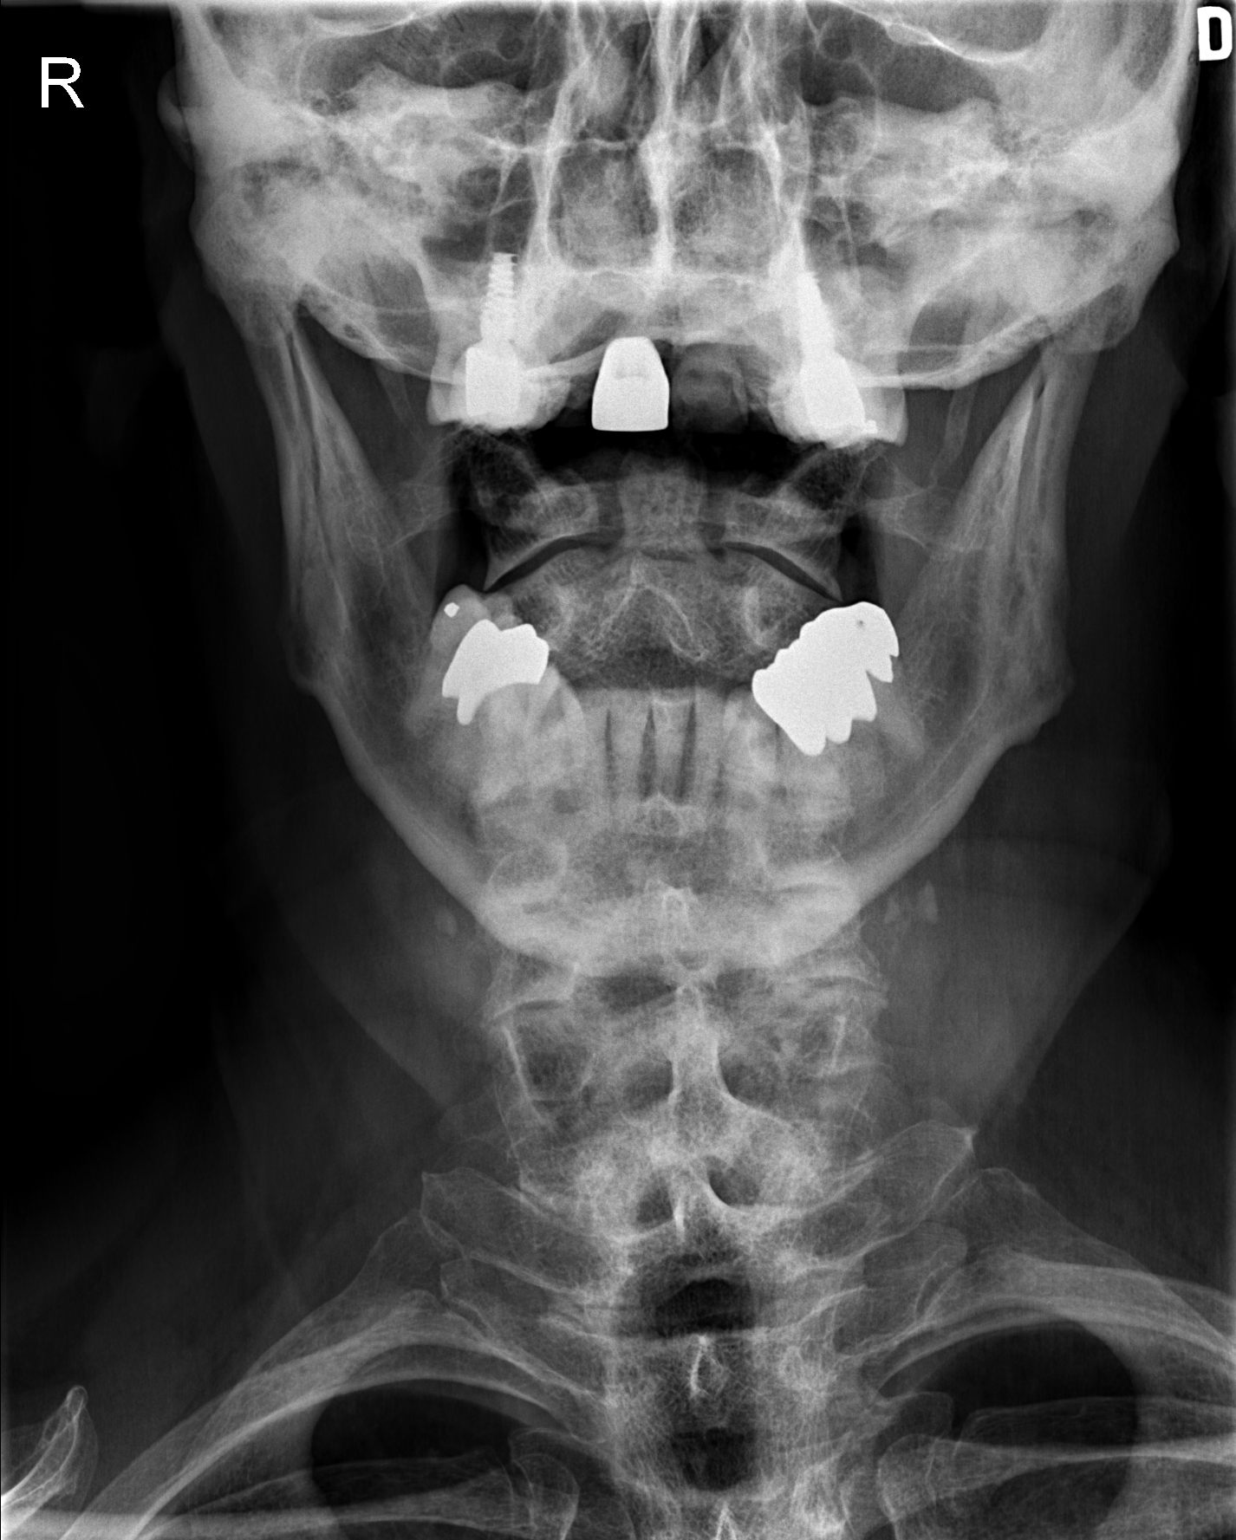

[3 of 3 positions shown; findings below may reference images not displayed]

FINDINGS: Frontal and lateral views of the cervical spine are obtained. There
is mild right convex curvature of the cervical spine. Otherwise
alignment is anatomic. Prominent spondylosis within the lower
cervical spine greatest at C5-6 and to the greatest extent C6-7.
Mild diffuse facet hypertrophy from C4 through C7. No acute
fractures. Prevertebral soft tissues are unremarkable. Lung apices
are clear.
IMPRESSION: 1. Multilevel spondylosis and facet hypertrophy, most pronounced at
C6-7.

## 2021-10-08 DIAGNOSIS — M5441 Lumbago with sciatica, right side: Secondary | ICD-10-CM | POA: Diagnosis not present

## 2021-10-08 DIAGNOSIS — M6281 Muscle weakness (generalized): Secondary | ICD-10-CM | POA: Diagnosis not present

## 2021-10-08 DIAGNOSIS — M25551 Pain in right hip: Secondary | ICD-10-CM | POA: Diagnosis not present

## 2021-10-10 DIAGNOSIS — M6281 Muscle weakness (generalized): Secondary | ICD-10-CM | POA: Diagnosis not present

## 2021-10-10 DIAGNOSIS — M5441 Lumbago with sciatica, right side: Secondary | ICD-10-CM | POA: Diagnosis not present

## 2021-10-10 DIAGNOSIS — M25551 Pain in right hip: Secondary | ICD-10-CM | POA: Diagnosis not present

## 2021-10-14 DIAGNOSIS — M5441 Lumbago with sciatica, right side: Secondary | ICD-10-CM | POA: Diagnosis not present

## 2021-10-14 DIAGNOSIS — M6281 Muscle weakness (generalized): Secondary | ICD-10-CM | POA: Diagnosis not present

## 2021-10-14 DIAGNOSIS — M25551 Pain in right hip: Secondary | ICD-10-CM | POA: Diagnosis not present

## 2021-10-17 DIAGNOSIS — M25551 Pain in right hip: Secondary | ICD-10-CM | POA: Diagnosis not present

## 2021-10-17 DIAGNOSIS — M5441 Lumbago with sciatica, right side: Secondary | ICD-10-CM | POA: Diagnosis not present

## 2021-10-17 DIAGNOSIS — M6281 Muscle weakness (generalized): Secondary | ICD-10-CM | POA: Diagnosis not present

## 2021-10-20 DIAGNOSIS — M5441 Lumbago with sciatica, right side: Secondary | ICD-10-CM | POA: Diagnosis not present

## 2021-10-20 DIAGNOSIS — M25551 Pain in right hip: Secondary | ICD-10-CM | POA: Diagnosis not present

## 2021-10-20 DIAGNOSIS — M6281 Muscle weakness (generalized): Secondary | ICD-10-CM | POA: Diagnosis not present

## 2021-10-24 DIAGNOSIS — M5441 Lumbago with sciatica, right side: Secondary | ICD-10-CM | POA: Diagnosis not present

## 2021-10-24 DIAGNOSIS — M25551 Pain in right hip: Secondary | ICD-10-CM | POA: Diagnosis not present

## 2021-10-24 DIAGNOSIS — M6281 Muscle weakness (generalized): Secondary | ICD-10-CM | POA: Diagnosis not present

## 2021-10-25 ENCOUNTER — Other Ambulatory Visit: Payer: Self-pay | Admitting: Interventional Cardiology

## 2021-10-28 DIAGNOSIS — M25551 Pain in right hip: Secondary | ICD-10-CM | POA: Diagnosis not present

## 2021-10-28 DIAGNOSIS — M5441 Lumbago with sciatica, right side: Secondary | ICD-10-CM | POA: Diagnosis not present

## 2021-10-28 DIAGNOSIS — M6281 Muscle weakness (generalized): Secondary | ICD-10-CM | POA: Diagnosis not present

## 2021-10-30 DIAGNOSIS — M6281 Muscle weakness (generalized): Secondary | ICD-10-CM | POA: Diagnosis not present

## 2021-10-30 DIAGNOSIS — M5441 Lumbago with sciatica, right side: Secondary | ICD-10-CM | POA: Diagnosis not present

## 2021-10-30 DIAGNOSIS — M25551 Pain in right hip: Secondary | ICD-10-CM | POA: Diagnosis not present

## 2021-11-11 DIAGNOSIS — M25551 Pain in right hip: Secondary | ICD-10-CM | POA: Diagnosis not present

## 2021-11-11 DIAGNOSIS — M6281 Muscle weakness (generalized): Secondary | ICD-10-CM | POA: Diagnosis not present

## 2021-11-11 DIAGNOSIS — M5441 Lumbago with sciatica, right side: Secondary | ICD-10-CM | POA: Diagnosis not present

## 2021-11-17 DIAGNOSIS — M6281 Muscle weakness (generalized): Secondary | ICD-10-CM | POA: Diagnosis not present

## 2021-11-17 DIAGNOSIS — M25551 Pain in right hip: Secondary | ICD-10-CM | POA: Diagnosis not present

## 2021-11-17 DIAGNOSIS — M5441 Lumbago with sciatica, right side: Secondary | ICD-10-CM | POA: Diagnosis not present

## 2021-12-22 IMAGING — DX DG CHEST 2V
2 series · 2 of 2 positions shown · non-contrast
Comparison: 04/25/2015

CLINICAL DATA: Chest pain

EXAM:
CHEST - 2 VIEW

[chest lat]
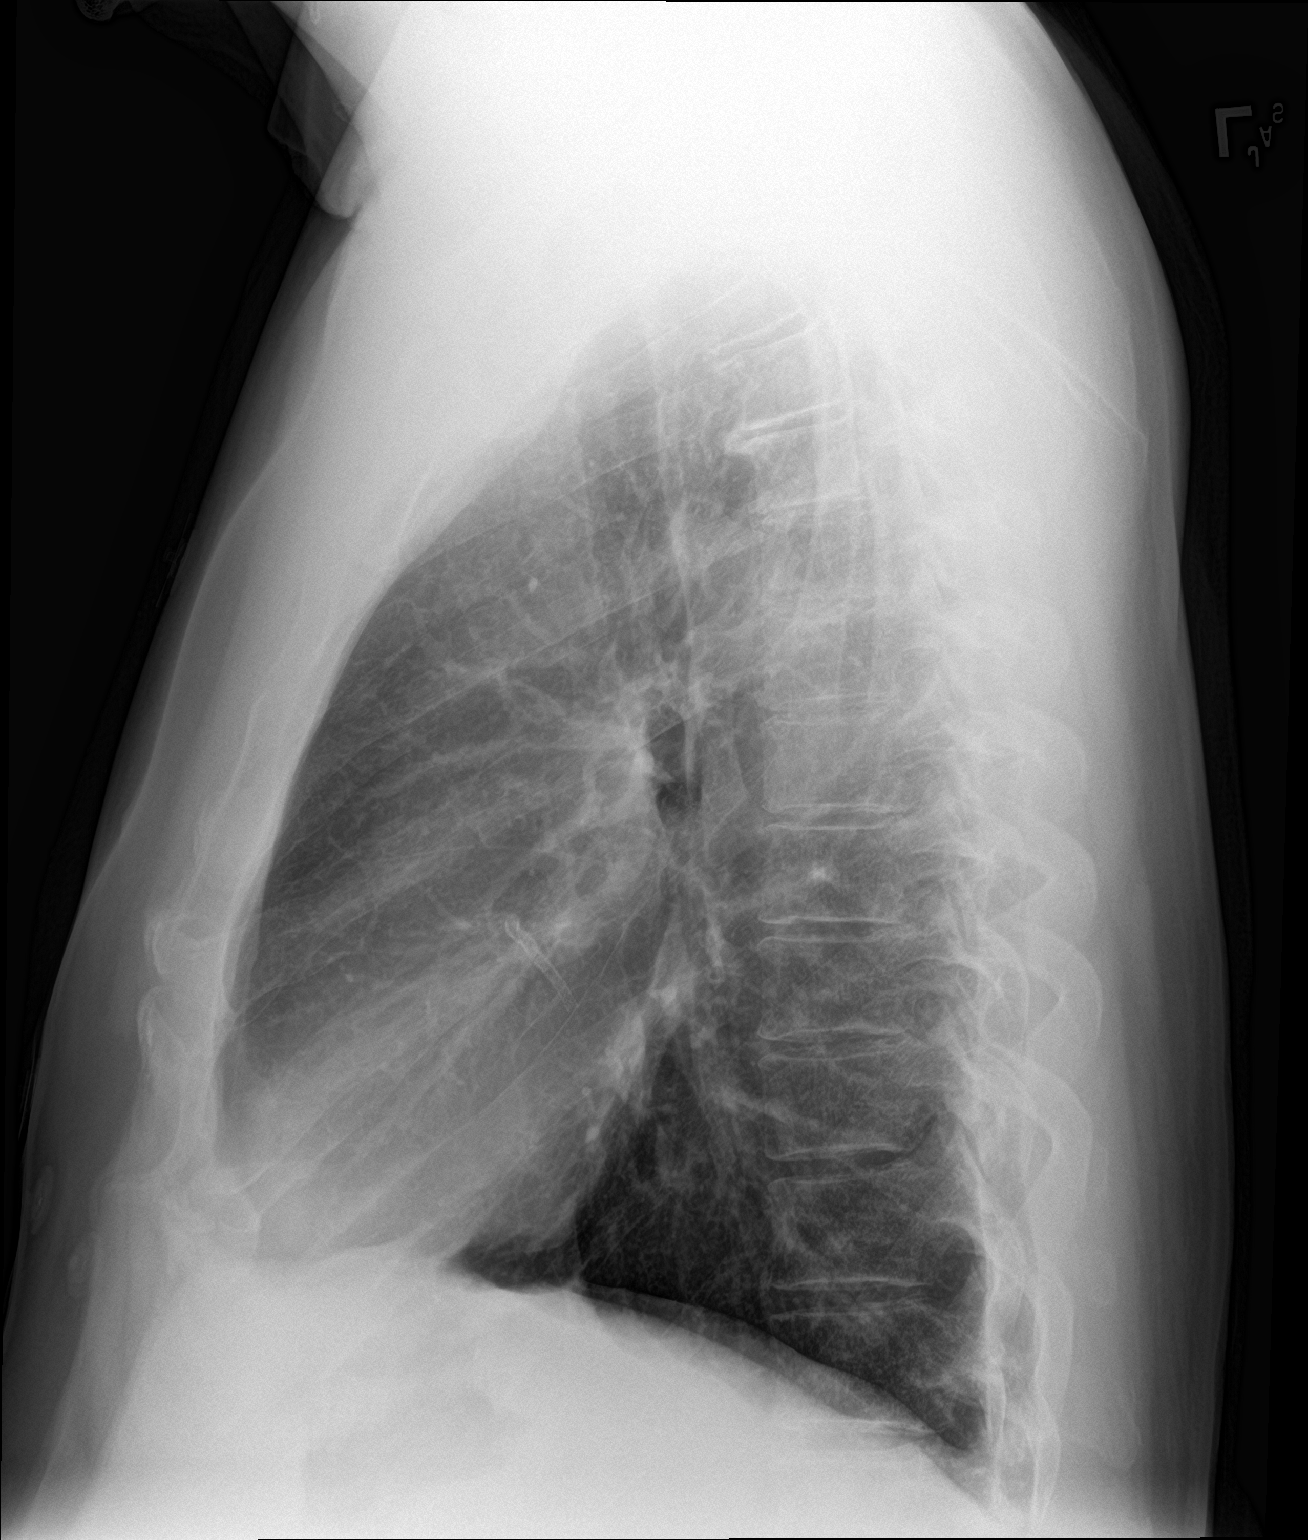

[chest pa]
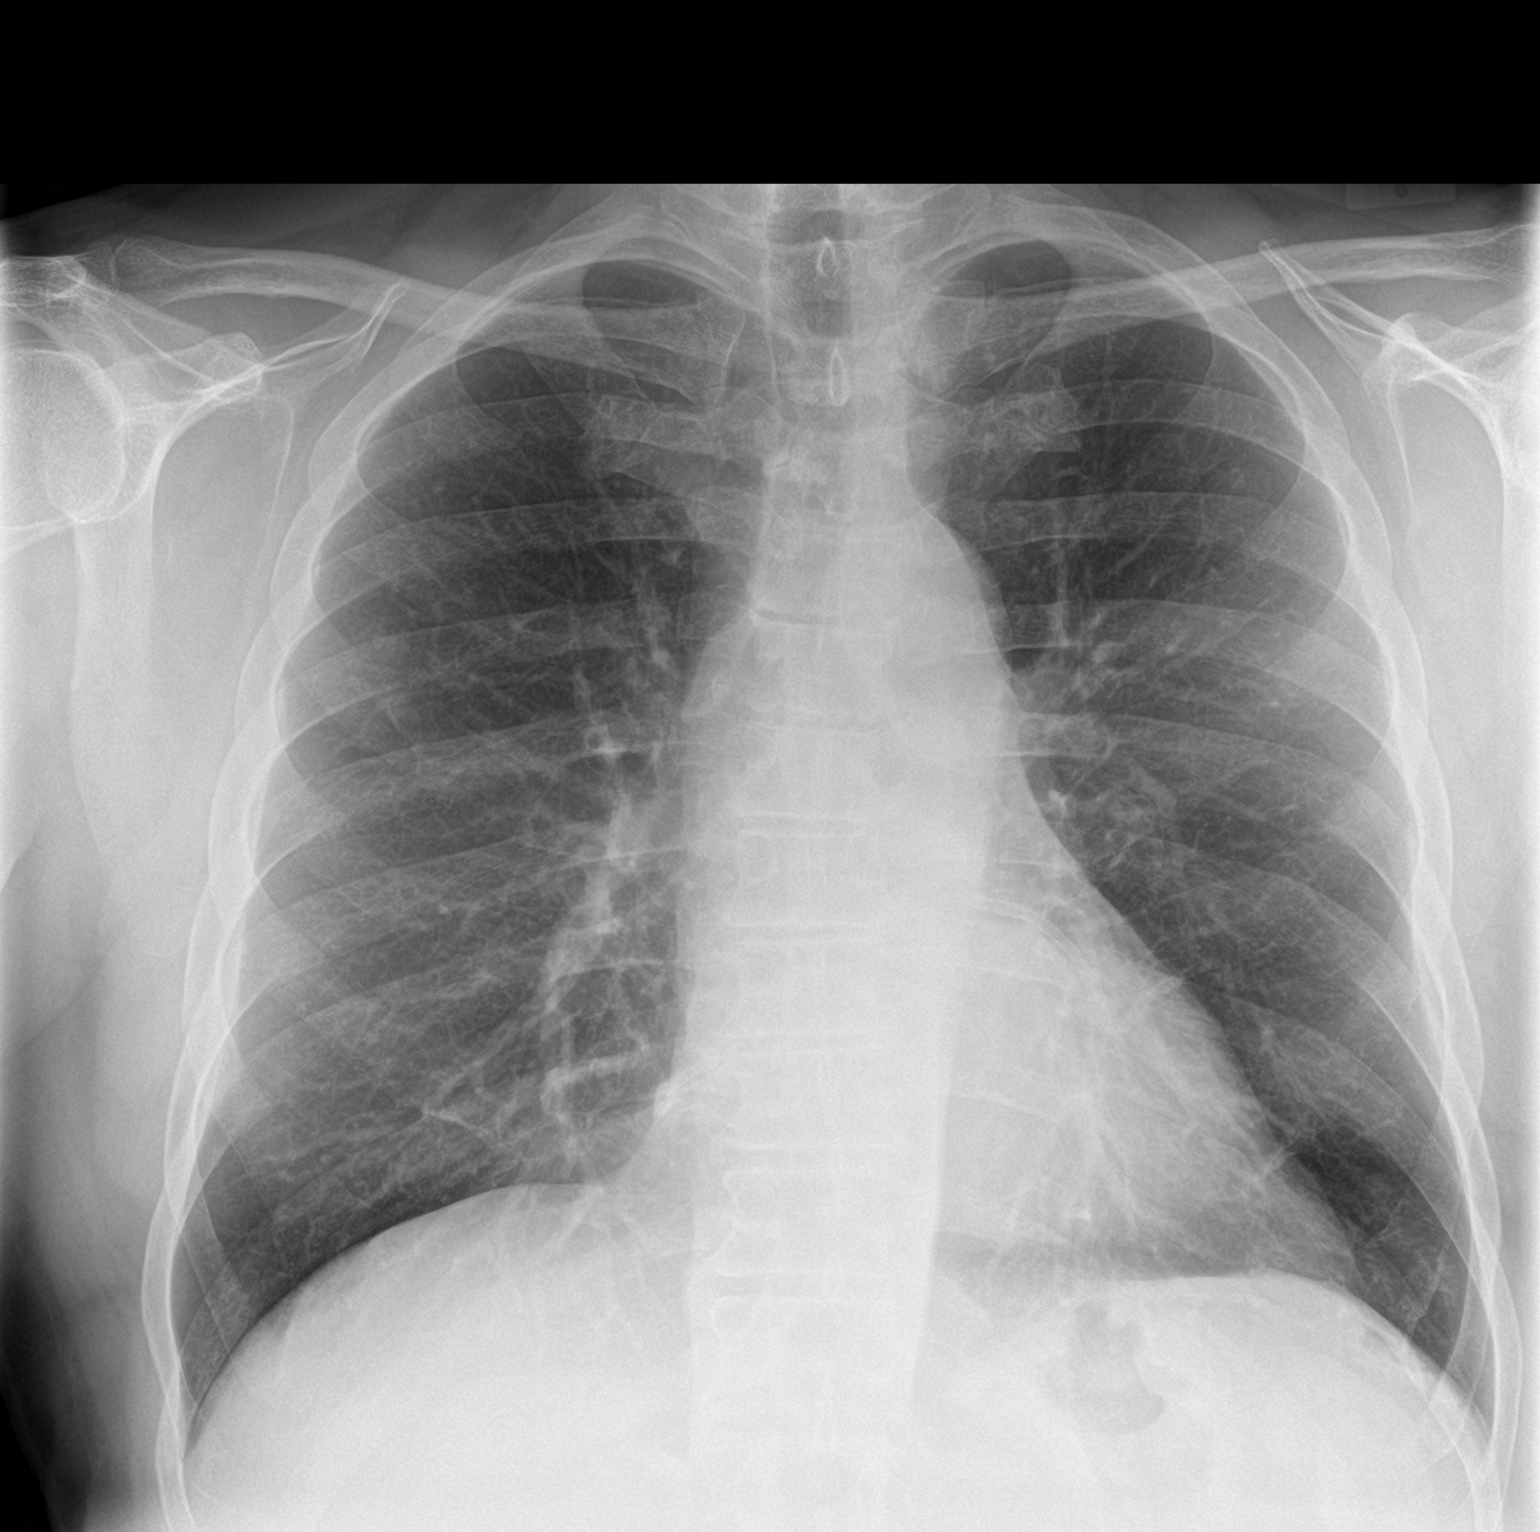

[2 of 2 positions shown; findings below may reference images not displayed]

FINDINGS: The heart size and mediastinal contours are within normal limits.
Both lungs are clear. The visualized skeletal structures are
unremarkable.
IMPRESSION: No active cardiopulmonary disease.

## 2021-12-24 ENCOUNTER — Telehealth: Payer: Self-pay | Admitting: Cardiovascular Disease

## 2021-12-24 NOTE — Telephone Encounter (Signed)
Returned call to patient and got him scheduled with Burt Knack 05/13/21 at 8:20am.

## 2021-12-24 NOTE — Telephone Encounter (Signed)
  Pt said, her brother is a Dr.Cooper's pt and since Dr. Tamala Julian is retiring, his brother spoke with RN Judson Roch and was told that Dr. Burt Knack will take him as a pt. Pt would like to speak with Judson Roch to confirm

## 2022-04-08 DIAGNOSIS — E118 Type 2 diabetes mellitus with unspecified complications: Secondary | ICD-10-CM | POA: Diagnosis not present

## 2022-04-08 DIAGNOSIS — Z1331 Encounter for screening for depression: Secondary | ICD-10-CM | POA: Diagnosis not present

## 2022-04-08 DIAGNOSIS — N183 Chronic kidney disease, stage 3 unspecified: Secondary | ICD-10-CM | POA: Diagnosis not present

## 2022-04-08 DIAGNOSIS — M109 Gout, unspecified: Secondary | ICD-10-CM | POA: Diagnosis not present

## 2022-04-08 DIAGNOSIS — E782 Mixed hyperlipidemia: Secondary | ICD-10-CM | POA: Diagnosis not present

## 2022-04-08 DIAGNOSIS — I10 Essential (primary) hypertension: Secondary | ICD-10-CM | POA: Diagnosis not present

## 2022-04-08 DIAGNOSIS — E538 Deficiency of other specified B group vitamins: Secondary | ICD-10-CM | POA: Diagnosis not present

## 2022-04-08 DIAGNOSIS — Z23 Encounter for immunization: Secondary | ICD-10-CM | POA: Diagnosis not present

## 2022-04-08 DIAGNOSIS — I700 Atherosclerosis of aorta: Secondary | ICD-10-CM | POA: Diagnosis not present

## 2022-04-08 DIAGNOSIS — E1159 Type 2 diabetes mellitus with other circulatory complications: Secondary | ICD-10-CM | POA: Diagnosis not present

## 2022-04-08 DIAGNOSIS — Z0000 Encounter for general adult medical examination without abnormal findings: Secondary | ICD-10-CM | POA: Diagnosis not present

## 2022-04-08 DIAGNOSIS — E042 Nontoxic multinodular goiter: Secondary | ICD-10-CM | POA: Diagnosis not present

## 2022-04-08 DIAGNOSIS — I25118 Atherosclerotic heart disease of native coronary artery with other forms of angina pectoris: Secondary | ICD-10-CM | POA: Diagnosis not present

## 2022-04-08 DIAGNOSIS — R7989 Other specified abnormal findings of blood chemistry: Secondary | ICD-10-CM | POA: Diagnosis not present

## 2022-04-08 DIAGNOSIS — K5080 Crohn's disease of both small and large intestine without complications: Secondary | ICD-10-CM | POA: Diagnosis not present

## 2022-04-08 DIAGNOSIS — E559 Vitamin D deficiency, unspecified: Secondary | ICD-10-CM | POA: Diagnosis not present

## 2022-05-13 NOTE — Progress Notes (Unsigned)
Cardiology Office Note:    Date:  05/14/2022   ID:  Jesus Stone, DOB 1955-07-26, MRN [id number]  PCP:  Jesus Stone   [facility] Providers Cardiologist:  Jesus Stone     Referring Stone: Jesus Stone   Chief Complaint  Patient presents with   Coronary Artery Disease    History of Present Illness:    Jesus Stone is a 67 y.o. male with a hx of CAD (BMS to ramus in 2000, DES in 2017 for ISR ramus), hypertension, hyperlipidemia, DM2, OSA, TIA, and obesity. He has been followed by Dr Jesus Stone and I will be assuming his cardiac care in Dr Jesus Stone retirement.   Today.  He has been doing very well, enjoying time.  2 grown children and 3 grandchildren who live locally.  Good bit of time with 66-year-old grandson and also has teenage children. He walks for exercise without exertional symptoms. Today, he denies symptoms of palpitations, chest pain, shortness of breath, orthopnea, PND, lower extremity edema, dizziness, or syncope. He has very good questions today about inflammation, insulin resistance, and PAI-1 gene mutation.  Past Medical History:  Diagnosis Date   Acute sinusitis, unspecified    Anal polyp    Bell's palsy    CAD (coronary artery disease)    a. BMSx2 to ramus 2000  b. 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   Crohn's disease    Diabetes mellitus, type 2    Gout, unspecified    HLD (hyperlipidemia)    HTN (hypertension)    Nontoxic multinodular goiter    Obesity    Periodic limb movement disorder    Sleep apnea    Squamous cell carcinoma of skin 04/04/2014   well diff-right crown ([id number])    Squamous cell carcinoma of skin 11/08/2018   left medial scalp (MO[facility])   Squamous cell carcinoma of skin 11/08/2018   KA-left preauricular- [place]   Thyroid nodule    TIA (transient ischemic attack)    Unspecified vitamin D deficiency     Past Surgical History:  Procedure Laterality Date    APPENDECTOMY  1964   CARDIAC CATHETERIZATION N/A 04/26/2015   Procedure: Left Heart Cath and Coronary Angiography;  Surgeon: Jesus Stone;  Location: [facility] INVASIVE CV LAB;  Service: Cardiovascular;  Laterality: N/A;   CARDIAC CATHETERIZATION N/A 04/26/2015   Procedure: Intravascular Pressure Wire/FFR Study;  Surgeon: Jesus Stone;  Location: [facility] INVASIVE CV LAB;  Service: Cardiovascular;  Laterality: N/A;   CARDIAC CATHETERIZATION N/A 04/26/2015   Procedure: Coronary Stent Intervention;  Surgeon: Jesus Stone;  Location: [facility] INVASIVE CV LAB;  Service: Cardiovascular;  Laterality: N/A;   Coronary artery stent placment  03/1998   HEEL SPUR EXCISION Right 2008   LEFT HEART CATH AND CORONARY ANGIOGRAPHY N/A 08/27/2020   Procedure: LEFT HEART CATH AND CORONARY ANGIOGRAPHY;  Surgeon: Jesus Stone;  Location: [facility] INVASIVE CV LAB;  Service: Cardiovascular;  Laterality: N/A;   RECTAL POLYPECTOMY  2008, 2009   TESTICLE SURGERY  1967    Current Medications: Current Meds  Medication Sig   aspirin EC 81 MG tablet Take 1 tablet (81 mg total) by mouth every Monday, Wednesday, and Friday. Swallow whole. (Patient taking differently: Take 81 mg by mouth daily. Swallow whole.)   B Complex CAPS as directed Orally   Cholecalciferol (VITAMIN D) 50 MCG (2000 UT) CAPS Take 1 capsule by  mouth daily.   Coenzyme Q10 (COQ10) 100 MG CAPS    fenofibrate (TRICOR) 145 MG tablet Take 145 mg by mouth daily.   Magnesium Glycinate 100 MG CAPS Take 1 capsule by mouth daily.   metFORMIN (GLUCOPHAGE) 500 MG tablet Take 500-1,000 mg by mouth See admin instructions. Take 1 tablet (500 mg) in the morning and Take 2 tablets (1000 mg) in the evening   nitroGLYCERIN (NITROSTAT) 0.4 MG SL tablet Place 1 tablet (0.4 mg total) under the tongue every 5 (five) minutes as needed for chest pain (x 3 doses).   Omega-3 1000 MG CAPS Take 1 capsule by mouth 2 (two) times daily.    rosuvastatin (CRESTOR) 20 MG tablet TAKE ONE  TABLET BY MOUTH ONE TIME DAILY   sulfaSALAzine (AZULFIDINE) 500 MG tablet TAKE ONE TABLET BY MOUTH THREE TIMES DAILY for 90   valsartan-hydrochlorothiazide (DIOVAN-HCT) 320-12.5 MG tablet TAKE ONE TABLET BY MOUTH ONE TIME DAILY   Zinc 50 MG TABS Take 1 tablet by mouth 3 (three) times a week. MWF     Allergies:   Lipitor [atorvastatin] and Simvastatin   Social History   Socioeconomic History   Marital status: Married    Spouse name: Jesus Stone   Number of children: 2   Years of education: college   Highest education level: Not on file  Occupational History   Occupation: [employer]    Employer: [employer]  Tobacco Use   Smoking status: Former    Types: Cigarettes    Quit date: 03/01/1984    Years since quitting: 38.2   Smokeless tobacco: Never  Substance and Sexual Activity   Alcohol use: No    Alcohol/week: 0.0 standard drinks of alcohol   Drug use: No   Sexual activity: Yes  Other Topics Concern   Not on file  Social History Narrative   Right handed male   Social Determinants of Health   Financial Resource Strain: Not on file  Food Insecurity: Not on file  Transportation Needs: Not on file  Physical Activity: Not on file  Stress: Not on file  Social Connections: Not on file     Family History: The patient's family history includes Cancer in his maternal grandmother, mother, and paternal aunt; Diabetes in his sister.  ROS:   Please see the history of present illness.    All other systems reviewed and are negative.  EKGs/Labs/Other Studies Reviewed:    The following studies were reviewed today: Cardiac Studies & Procedures   CARDIAC CATHETERIZATION  CARDIAC CATHETERIZATION 08/27/2020  Narrative   Previously placed Ramus stent (several previous stents covered with 1 single 3.0 mL) 38 mm DES) is  widely patent.   Prox LAD lesion is 40% stenosed.  Mid LAD lesion is 45% stenosed.   Prox RCA to Mid RCA lesion is 25% stenosed.   RPDA lesion is 30% stenosed.   The  left ventricular systolic function is normal.  The left ventricular ejection fraction is 55-65% by visual estimate.   LV end diastolic pressure is normal.   There is no aortic valve stenosis.  SUMMARY Stable multivessel CAD with: Widely patent Ramus Intermedius Stent, moderate proximal and mid LAD disease, and mild RCA/RPDA disease. Suspect nonanginal chest pain Normal/preserved EF with normal EDP.  Findings Coronary Findings Diagnostic  Dominance: Right  Left Main Vessel is large.  Left Anterior Descending Vessel is large. The vessel exhibits minimal luminal irregularities. Prox LAD lesion is 40% stenosed. The lesion is located at the bend. Mid LAD  lesion is 45% stenosed. The lesion is located at the major branch and eccentric. The lesion is mildly calcified. Mild progression of disease  First Diagonal Branch Vessel is small in size. There is mild disease in the vessel.  Ramus Intermedius Vessel is Large in size Previously placed Ramus stent (unknown type) is  widely patent. Overlapping stents from many years ago - covered with a 3.0 mm x 38 mm DES Previously placed stent displays no restenosis.  Lateral Ramus Intermedius Vessel is small in size.  Left Circumflex The vessel exhibits minimal luminal irregularities.  First Obtuse Marginal Branch Vessel is small in size.  Left Posterior Atrioventricular Artery Vessel is small in size.  Right Coronary Artery Vessel was injected. Vessel is large. There is mild diffuse disease throughout the vessel. Prox RCA to Mid RCA lesion is 25% stenosed. The lesion is segmental and eccentric.  Acute Marginal Branch Vessel is small in size.  Right Ventricular Branch Vessel is small in size.  Right Posterior Descending Artery RPDA lesion is 30% stenosed. The lesion is distal to major branch and discrete.  Right Posterior Atrioventricular Artery Vessel is Moderate in size  Second Right Posterolateral Branch Vessel is moderate  in size.  Intervention  No interventions have been documented.   CARDIAC CATHETERIZATION  CARDIAC CATHETERIZATION 04/26/2015  Narrative  Mid RCA lesion, 20% stenosed.  Prox LAD lesion, 20% stenosed.  The left ventricular systolic function is normal.  Ost Ramus to Ramus lesion, 80% stenosed. Post intervention, there is a 0% residual stenosis. The lesion was previously treated with a bare metal stent greater than two years ago.  1. Single vessel obstructive CAD with late restenosis of the ramus intermediate branch. 2. Normal LV function 3. Successful stenting of the ramus intermediate with a DES.  Plan: DAPT for one year. Anticipate DC in am.  Findings Coronary Findings Diagnostic  Dominance: Right  Left Main Vessel was injected. Vessel is normal in caliber. Vessel is angiographically normal.  Left Anterior Descending  First Septal Branch  Ramus Intermedius . Vessel is large. Diffuse. The lesion was previously treated with a bare metal stent greater than two years ago.  Pressure wire/FFR was performed on the lesion.  FFR: 0.76.  Left Circumflex Vessel was injected. Vessel is normal in caliber. Vessel is angiographically normal.  Right Coronary Artery Discrete.  Intervention  Ost Ramus to Ramus lesion PCI The pre-interventional distal flow is normal (TIMI 3). Pre-stent angioplasty was performed. 2.5 mm balloon A drug-eluting stent was placed. Post-stent angioplasty was performed. 3.25 mm [place] balloon Maximum pressure: 16 atm. The post-interventional distal flow is normal (TIMI 3). The intervention was successful. No complications occurred at this lesion. Supplies used: STENT PROMUS PREM MR 3.0X38 There is a 0% residual stenosis post intervention.                 EKG:  EKG is ordered today.  The ekg ordered today demonstrates sinus bradycardia 56 bpm, within normal limits  Recent Labs: No results found for requested labs within last 365 days.  Recent Lipid  Panel    Component Value Date/Time   CHOL 89 (L) 11/19/2020 0745   TRIG 147 11/19/2020 0745   HDL 34 (L) 11/19/2020 0745   CHOLHDL 2.6 11/19/2020 0745   CHOLHDL 3.0 08/27/2020 0500   VLDL 37 08/27/2020 0500   LDLCALC 30 11/19/2020 0745     Risk Assessment/Calculations:                Physical Exam:  VS:  BP 120/80   Pulse (!) 56   Ht 5\' 11"  (1.803 m)   Wt 208 lb 12.8 oz (94.7 kg)   SpO2 98%   BMI 29.12 kg/m     Wt Readings from Last 3 Encounters:  05/14/22 208 lb 12.8 oz (94.7 kg)  03/24/21 213 lb (96.6 kg)  09/18/20 222 lb 9.6 oz (101 kg)     GEN:  Well nourished, well developed in no acute distress HEENT: Normal NECK: No JVD; No carotid bruits LYMPHATICS: No lymphadenopathy CARDIAC: RRR, no murmurs, rubs, gallops RESPIRATORY:  Clear to auscultation without rales, wheezing or rhonchi  ABDOMEN: Soft, non-tender, non-distended MUSCULOSKELETAL:  No edema; No deformity  SKIN: Warm and dry NEUROLOGIC:  Alert and oriented x 3 PSYCHIATRIC:  Normal affect   ASSESSMENT:    1. Coronary artery disease of native artery of native heart with stable angina pectoris   2. Mixed hyperlipidemia   3. Essential hypertension   4. Type 2 diabetes mellitus with complication, without long-term current use of insulin    PLAN:    In order of problems listed above:  The patient is stable without symptoms of angina.  His medical program is reviewed and is appropriate with a high intensity statin drug, aspirin, lipid-lowering therapy with LDL less than 55.  We discussed the importance of his diet and exercise regimen. Lipids at goal, LDL <55. Advised to consider discontinuation of niacin and continue rosuvastatin and fenofibrate at current doses. States triglycerides used to be >500, now with LDL 23, trig 78. BP well-controlled on current Rx. Labs reviewed. Creatinine 1.24 HgB A1C 5.7  Overall the patient appears to be doing very well from a CV perspective. Will check a LipoMed  Panel and CRP today, see back in one year.      Medication Adjustments/Labs and Tests Ordered: Current medicines are reviewed at length with the patient today.  Concerns regarding medicines are outlined above.  Orders Placed This Encounter  Procedures   Lipoprotein A (LPA)   Apolipoprotein B   NMR, lipoprofile   CRP High sensitivity   EKG 12-Lead   No orders of the defined types were placed in this encounter.   Patient Instructions  Medication Instructions:  Your physician recommends that you continue on your current medications as directed. Please refer to the Current Medication list given to you today.  *If you need a refill on your cardiac medications before your next appointment, please call your pharmacy*   Lab Work: Lipomed panel, CRP If you have labs (blood work) drawn today and your tests are completely normal, you will receive your results only by: MyChart Message (if you have MyChart) OR A paper copy in the mail If you have any lab test that is abnormal or we need to change your treatment, we will call you to review the results.   Testing/Procedures: NONE   Follow-Up: At [facility], you and your health needs are our priority.  As part of our continuing mission to provide you with exceptional heart care, we have created designated Provider Care Teams.  These Care Teams include your primary Cardiologist (physician) and Advanced Practice Providers (APPs -  Physician Assistants and Nurse Practitioners) who all work together to provide you with the care you need, when you need it.  We recommend signing up for the patient portal called "MyChart".  Sign up information is provided on this After Visit Summary.  MyChart is used to connect with patients for Virtual Visits (Telemedicine).  Patients are able to view lab/test results, encounter notes, upcoming appointments, etc.  Non-urgent messages can be sent to your provider as well.   To learn more about what you can  do with MyChart, go to [email or web address].    Your next appointment:   1 year(s)  Provider:   Tonny Bollman, Stone      Signed, Jesus Stone  05/14/2022 9:09 AM    [facility]

## 2022-05-14 ENCOUNTER — Encounter: Payer: Self-pay | Admitting: Cardiovascular Disease

## 2022-05-14 ENCOUNTER — Ambulatory Visit: Payer: Medicare Other | Attending: Cardiovascular Disease | Admitting: Cardiovascular Disease

## 2022-05-14 VITALS — BP 120/80 | HR 56 | Ht 71.0 in | Wt 208.8 lb

## 2022-05-14 DIAGNOSIS — I25118 Atherosclerotic heart disease of native coronary artery with other forms of angina pectoris: Secondary | ICD-10-CM | POA: Diagnosis not present

## 2022-05-14 DIAGNOSIS — E118 Type 2 diabetes mellitus with unspecified complications: Secondary | ICD-10-CM | POA: Insufficient documentation

## 2022-05-14 DIAGNOSIS — E782 Mixed hyperlipidemia: Secondary | ICD-10-CM | POA: Diagnosis not present

## 2022-05-14 DIAGNOSIS — I10 Essential (primary) hypertension: Secondary | ICD-10-CM | POA: Diagnosis not present

## 2022-05-14 NOTE — Patient Instructions (Signed)
Medication Instructions:  Your physician recommends that you continue on your current medications as directed. Please refer to the Current Medication list given to you today.  *If you need a refill on your cardiac medications before your next appointment, please call your pharmacy*   Lab Work: Lipomed panel, CRP If you have labs (blood work) drawn today and your tests are completely normal, you will receive your results only by: MyChart Message (if you have MyChart) OR A paper copy in the mail If you have any lab test that is abnormal or we need to change your treatment, we will call you to review the results.   Testing/Procedures: NONE   Follow-Up: At [facility], you and your health needs are our priority.  As part of our continuing mission to provide you with exceptional heart care, we have created designated Provider Care Teams.  These Care Teams include your primary Cardiologist (physician) and Advanced Practice Providers (APPs -  Physician Assistants and Nurse Practitioners) who all work together to provide you with the care you need, when you need it.  We recommend signing up for the patient portal called "MyChart".  Sign up information is provided on this After Visit Summary.  MyChart is used to connect with patients for Virtual Visits (Telemedicine).  Patients are able to view lab/test results, encounter notes, upcoming appointments, etc.  Non-urgent messages can be sent to your provider as well.   To learn more about what you can do with MyChart, go to [email or web address].    Your next appointment:   1 year(s)  Provider:   Tonny Bollman, MD

## 2022-05-15 LAB — NMR, LIPOPROFILE
Cholesterol, Total: 98 mg/dL — ABNORMAL LOW (ref 100–199)
HDL Particle Number: 39.1 umol/L (ref >=30.5)
HDL-C: 56 mg/dL (ref >39)
LDL Particle Number: 387 nmol/L (ref <1000)
LDL Size: 19.7 nm — ABNORMAL LOW (ref >20.5)
LDL-C (NIH Calc): 25 mg/dL (ref 0–99)
LP-IR Score: 42 (ref <=45)
Small LDL Particle Number: 210 nmol/L (ref <=527)
Triglycerides: 82 mg/dL (ref 0–149)

## 2022-05-15 LAB — LIPOPROTEIN A (LPA): Lipoprotein (a): 8.7 nmol/L (ref <75.0)

## 2022-05-15 LAB — HIGH SENSITIVITY CRP: CRP, High Sensitivity: 0.43 mg/L (ref 0.00–3.00)

## 2022-05-15 LAB — APOLIPOPROTEIN B: Apolipoprotein B: 37 mg/dL (ref <90)

## 2022-05-21 DIAGNOSIS — L814 Other melanin hyperpigmentation: Secondary | ICD-10-CM | POA: Diagnosis not present

## 2022-05-21 DIAGNOSIS — L821 Other seborrheic keratosis: Secondary | ICD-10-CM | POA: Diagnosis not present

## 2022-05-21 DIAGNOSIS — L820 Inflamed seborrheic keratosis: Secondary | ICD-10-CM | POA: Diagnosis not present

## 2022-05-21 DIAGNOSIS — L570 Actinic keratosis: Secondary | ICD-10-CM | POA: Diagnosis not present

## 2022-05-21 DIAGNOSIS — D225 Melanocytic nevi of trunk: Secondary | ICD-10-CM | POA: Diagnosis not present

## 2022-05-21 DIAGNOSIS — L538 Other specified erythematous conditions: Secondary | ICD-10-CM | POA: Diagnosis not present

## 2022-05-21 DIAGNOSIS — R208 Other disturbances of skin sensation: Secondary | ICD-10-CM | POA: Diagnosis not present

## 2022-05-21 DIAGNOSIS — Z85828 Personal history of other malignant neoplasm of skin: Secondary | ICD-10-CM | POA: Diagnosis not present

## 2022-05-21 DIAGNOSIS — L298 Other pruritus: Secondary | ICD-10-CM | POA: Diagnosis not present

## 2022-05-21 DIAGNOSIS — Z789 Other specified health status: Secondary | ICD-10-CM | POA: Diagnosis not present

## 2022-05-21 DIAGNOSIS — Z08 Encounter for follow-up examination after completed treatment for malignant neoplasm: Secondary | ICD-10-CM | POA: Diagnosis not present

## 2022-06-12 DIAGNOSIS — R7309 Other abnormal glucose: Secondary | ICD-10-CM | POA: Diagnosis not present

## 2022-06-12 DIAGNOSIS — H35032 Hypertensive retinopathy, left eye: Secondary | ICD-10-CM | POA: Diagnosis not present

## 2022-06-12 DIAGNOSIS — H40013 Open angle with borderline findings, low risk, bilateral: Secondary | ICD-10-CM | POA: Diagnosis not present

## 2022-06-12 DIAGNOSIS — H2513 Age-related nuclear cataract, bilateral: Secondary | ICD-10-CM | POA: Diagnosis not present

## 2022-07-01 DIAGNOSIS — H40012 Open angle with borderline findings, low risk, left eye: Secondary | ICD-10-CM | POA: Diagnosis not present

## 2022-07-22 ENCOUNTER — Ambulatory Visit: Payer: Medicare Other | Admitting: Dermatology

## 2022-07-29 DIAGNOSIS — H40013 Open angle with borderline findings, low risk, bilateral: Secondary | ICD-10-CM | POA: Diagnosis not present

## 2022-09-09 ENCOUNTER — Other Ambulatory Visit: Payer: Self-pay | Admitting: *Deleted

## 2022-09-09 MED ORDER — ROSUVASTATIN CALCIUM 20 MG PO TABS: 20.0000 mg | ORAL_TABLET | Freq: Every day | ORAL | 2 refills | Status: DC

## 2022-12-11 DIAGNOSIS — Z23 Encounter for immunization: Secondary | ICD-10-CM | POA: Diagnosis not present

## 2022-12-29 DIAGNOSIS — H40013 Open angle with borderline findings, low risk, bilateral: Secondary | ICD-10-CM | POA: Diagnosis not present

## 2023-03-09 DIAGNOSIS — L570 Actinic keratosis: Secondary | ICD-10-CM | POA: Diagnosis not present

## 2023-03-09 DIAGNOSIS — B079 Viral wart, unspecified: Secondary | ICD-10-CM | POA: Diagnosis not present

## 2023-03-09 DIAGNOSIS — B078 Other viral warts: Secondary | ICD-10-CM | POA: Diagnosis not present

## 2023-04-13 DIAGNOSIS — Z8673 Personal history of transient ischemic attack (TIA), and cerebral infarction without residual deficits: Secondary | ICD-10-CM | POA: Diagnosis not present

## 2023-04-13 DIAGNOSIS — R252 Cramp and spasm: Secondary | ICD-10-CM | POA: Diagnosis not present

## 2023-04-13 DIAGNOSIS — M109 Gout, unspecified: Secondary | ICD-10-CM | POA: Diagnosis not present

## 2023-04-13 DIAGNOSIS — E782 Mixed hyperlipidemia: Secondary | ICD-10-CM | POA: Diagnosis not present

## 2023-04-13 DIAGNOSIS — I25118 Atherosclerotic heart disease of native coronary artery with other forms of angina pectoris: Secondary | ICD-10-CM | POA: Diagnosis not present

## 2023-04-13 DIAGNOSIS — E538 Deficiency of other specified B group vitamins: Secondary | ICD-10-CM | POA: Diagnosis not present

## 2023-04-13 DIAGNOSIS — E118 Type 2 diabetes mellitus with unspecified complications: Secondary | ICD-10-CM | POA: Diagnosis not present

## 2023-04-13 DIAGNOSIS — E042 Nontoxic multinodular goiter: Secondary | ICD-10-CM | POA: Diagnosis not present

## 2023-04-13 DIAGNOSIS — G4730 Sleep apnea, unspecified: Secondary | ICD-10-CM | POA: Diagnosis not present

## 2023-04-13 DIAGNOSIS — E559 Vitamin D deficiency, unspecified: Secondary | ICD-10-CM | POA: Diagnosis not present

## 2023-04-13 DIAGNOSIS — K5080 Crohn's disease of both small and large intestine without complications: Secondary | ICD-10-CM | POA: Diagnosis not present

## 2023-04-13 DIAGNOSIS — Z0000 Encounter for general adult medical examination without abnormal findings: Secondary | ICD-10-CM | POA: Diagnosis not present

## 2023-04-13 DIAGNOSIS — E1159 Type 2 diabetes mellitus with other circulatory complications: Secondary | ICD-10-CM | POA: Diagnosis not present

## 2023-04-13 DIAGNOSIS — N183 Chronic kidney disease, stage 3 unspecified: Secondary | ICD-10-CM | POA: Diagnosis not present

## 2023-04-13 DIAGNOSIS — I10 Essential (primary) hypertension: Secondary | ICD-10-CM | POA: Diagnosis not present

## 2023-04-13 DIAGNOSIS — Z1331 Encounter for screening for depression: Secondary | ICD-10-CM | POA: Diagnosis not present

## 2023-04-13 LAB — BASIC METABOLIC PANEL (7): EGFR (Non-African Amer.): 60

## 2023-04-13 LAB — HEMOGLOBIN A1C: A1c: 6

## 2023-04-13 LAB — TSH: TSH: 1.03 (ref 0.41–5.90)

## 2023-04-27 DIAGNOSIS — R0683 Snoring: Secondary | ICD-10-CM | POA: Diagnosis not present

## 2023-04-30 DIAGNOSIS — H40013 Open angle with borderline findings, low risk, bilateral: Secondary | ICD-10-CM | POA: Diagnosis not present

## 2023-05-05 DIAGNOSIS — Z136 Encounter for screening for cardiovascular disorders: Secondary | ICD-10-CM | POA: Diagnosis not present

## 2023-05-05 DIAGNOSIS — Z87891 Personal history of nicotine dependence: Secondary | ICD-10-CM | POA: Diagnosis not present

## 2023-05-10 ENCOUNTER — Encounter: Payer: Self-pay | Admitting: Cardiovascular Disease

## 2023-05-10 ENCOUNTER — Ambulatory Visit: Payer: Medicare Other | Attending: Cardiovascular Disease | Admitting: Cardiovascular Disease

## 2023-05-10 VITALS — BP 126/72 | HR 69 | Ht 71.0 in | Wt 205.6 lb

## 2023-05-10 DIAGNOSIS — E782 Mixed hyperlipidemia: Secondary | ICD-10-CM | POA: Diagnosis not present

## 2023-05-10 DIAGNOSIS — I25118 Atherosclerotic heart disease of native coronary artery with other forms of angina pectoris: Secondary | ICD-10-CM | POA: Diagnosis not present

## 2023-05-10 DIAGNOSIS — R079 Chest pain, unspecified: Secondary | ICD-10-CM | POA: Diagnosis not present

## 2023-05-10 DIAGNOSIS — E118 Type 2 diabetes mellitus with unspecified complications: Secondary | ICD-10-CM | POA: Diagnosis not present

## 2023-05-10 DIAGNOSIS — I10 Essential (primary) hypertension: Secondary | ICD-10-CM | POA: Diagnosis not present

## 2023-05-10 MED ORDER — ROSUVASTATIN CALCIUM 20 MG PO TABS: 20.0000 mg | ORAL_TABLET | Freq: Every day | ORAL | 3 refills | Status: AC

## 2023-05-10 MED ORDER — NITROGLYCERIN 0.4 MG SL SUBL: 0.4000 mg | SUBLINGUAL_TABLET | SUBLINGUAL | 3 refills | Status: AC | PRN

## 2023-05-10 NOTE — Assessment & Plan Note (Signed)
 Lipids as outlined above, LDL at goal of less than 55, continue rosuvastatin and fenofibrate

## 2023-05-10 NOTE — Patient Instructions (Signed)
 Follow-Up: At [facility], you and your health needs are our priority.  As part of our continuing mission to provide you with exceptional heart care, our providers are all part of one team.  This team includes your primary Cardiologist (physician) and Advanced Practice Providers or APPs (Physician Assistants and Nurse Practitioners) who all work together to provide you with the care you need, when you need it.  Your next appointment:   1 year(s)  Provider:   Tonny Bollman, MD        1st Floor: - Lobby - Registration  - Pharmacy  - Lab - Cafe  2nd Floor: - PV Lab - Diagnostic Testing (echo, CT, nuclear med)  3rd Floor: - Vacant  4th Floor: - TCTS (cardiothoracic surgery) - AFib Clinic - Structural Heart Clinic - Vascular Surgery  - Vascular Ultrasound  5th Floor: - HeartCare Cardiology (general and EP) - Clinical Pharmacy for coumadin, hypertension, lipid, weight-loss medications, and med management appointments    Valet parking services will be available as well.

## 2023-05-10 NOTE — Progress Notes (Signed)
 Cardiology Office Note:    Date:  05/10/2023   ID:  Jesus Stone, DOB 1955/10/19, MRN [id number]  PCP:  Marden Noble, MD (Inactive)   [facility] Providers Cardiologist:  Tonny Bollman, MD     Referring MD: No ref. provider found   Chief Complaint  Patient presents with   Coronary Artery Disease    History of Present Illness:    Jesus Stone is a 68 y.o. male with a hx of  CAD (BMS to ramus in 2000, DES in 2017 for ISR ramus), hypertension, hyperlipidemia, DM2, OSA, TIA, and obesity. He presents for follow-up evaluation today.  The patient has undergone PCI procedures in the past as outlined above.  He has done very well since his most recent PCI procedure in 2017.  Today, he denies symptoms of chest pain, chest pressure, or shortness of breath.  He is moderately active with no exertional symptoms.  He understands the need to increase resistance training and focus more on exercise.  He has not been able to find a gym that fits his needs well.  He is going to work on this.  He had recent labs drawn with a TSH of 1.03, apolipoprotein B of 34 (normal less than 90), hemoglobin A1c 6.0, fasting glucose 110, creatinine 1.29, uric acid 4.4, homocystine of 18 with the upper limit of normal 17.2, lipoprotein a of less than 8.4, and cholesterol 85, triglycerides 278, LDL 6, HDL 40.  He brings in an abdominal aortic ultrasound which showed normal aortic dimensions with no evidence of aneurysm.   Current Medications: Current Meds  Medication Sig   aspirin EC 81 MG tablet Take 1 tablet (81 mg total) by mouth every Monday, Wednesday, and Friday. Swallow whole. (Patient taking differently: Take 81 mg by mouth daily. Swallow whole.)   B Complex CAPS as directed Orally   Cholecalciferol (VITAMIN D) 50 MCG (2000 UT) CAPS Take 1 capsule by mouth daily.   Coenzyme Q10 (COQ10) 100 MG CAPS    Cyanocobalamin (VITAMIN B 12 PO) Take by mouth. Per patient taking 1 capsule (1,000) mcg daily    fenofibrate (TRICOR) 145 MG tablet Take 145 mg by mouth daily.   fluorouracil (EFUDEX) 5 % cream Apply topically daily.   Magnesium Glycinate 100 MG CAPS Take 1 capsule by mouth daily.   metFORMIN (GLUCOPHAGE) 500 MG tablet Take 500-1,000 mg by mouth See admin instructions. Take 2 tablet (500 mg) in the morning and Take 2 tablets (1000 mg) in the evening   nitroGLYCERIN (NITROSTAT) 0.4 MG SL tablet Place 1 tablet (0.4 mg total) under the tongue every 5 (five) minutes as needed for chest pain (x 3 doses).   Omega-3 1000 MG CAPS Take 1 capsule by mouth 2 (two) times daily.    OVER THE COUNTER MEDICATION Tumeric 6,750 mg taken 3 capsules daily   OVER THE COUNTER MEDICATION Vitamin K 12(MK7) 540 mcg 3 capsules a day   OVER THE COUNTER MEDICATION Berberine Phytosome 1,100 taken 2 capsules daily   OVER THE COUNTER MEDICATION Raw B-complex taking 2 capsules daily   OVER THE COUNTER MEDICATION    OVER THE COUNTER MEDICATION Raw Vitamin C 2000 mg taking 4 capsules daily   rosuvastatin (CRESTOR) 20 MG tablet Take 1 tablet (20 mg total) by mouth daily.   sulfaSALAzine (AZULFIDINE) 500 MG tablet TAKE ONE TABLET BY MOUTH THREE TIMES DAILY for 90   valsartan-hydrochlorothiazide (DIOVAN-HCT) 320-25 MG tablet Take 1 tablet by mouth daily.   Zinc  50 MG TABS Take 1 tablet by mouth 3 (three) times a week. MWF   [DISCONTINUED] nitroGLYCERIN (NITROSTAT) 0.4 MG SL tablet Place 1 tablet (0.4 mg total) under the tongue every 5 (five) minutes as needed for chest pain (x 3 doses).   [DISCONTINUED] rosuvastatin (CRESTOR) 20 MG tablet Take 1 tablet (20 mg total) by mouth daily.     Allergies:   Lipitor [atorvastatin] and Simvastatin   ROS:   Please see the history of present illness.    All other systems reviewed and are negative.  EKGs/Labs/Other Studies Reviewed:    The following studies were reviewed today: Cardiac Studies & Procedures    ______________________________________________________________________________________________ CARDIAC CATHETERIZATION  CARDIAC CATHETERIZATION 08/27/2020  Narrative   Previously placed Ramus stent (several previous stents covered with 1 single 3.0 mL) 38 mm DES) is  widely patent.   Prox LAD lesion is 40% stenosed.  Mid LAD lesion is 45% stenosed.   Prox RCA to Mid RCA lesion is 25% stenosed.   RPDA lesion is 30% stenosed.   The left ventricular systolic function is normal.  The left ventricular ejection fraction is 55-65% by visual estimate.   LV end diastolic pressure is normal.   There is no aortic valve stenosis.  SUMMARY Stable multivessel CAD with: Widely patent Ramus Intermedius Stent, moderate proximal and mid LAD disease, and mild RCA/RPDA disease. Suspect nonanginal chest pain Normal/preserved EF with normal EDP.  Findings Coronary Findings Diagnostic  Dominance: Right  Left Main Vessel is large.  Left Anterior Descending Vessel is large. The vessel exhibits minimal luminal irregularities. Prox LAD lesion is 40% stenosed. The lesion is located at the bend. Mid LAD lesion is 45% stenosed. The lesion is located at the major branch and eccentric. The lesion is mildly calcified. Mild progression of disease  First Diagonal Branch Vessel is small in size. There is mild disease in the vessel.  Ramus Intermedius Vessel is Large in size Previously placed Ramus stent (unknown type) is  widely patent. Overlapping stents from many years ago - covered with a 3.0 mm x 38 mm DES Previously placed stent displays no restenosis.  Lateral Ramus Intermedius Vessel is small in size.  Left Circumflex The vessel exhibits minimal luminal irregularities.  First Obtuse Marginal Branch Vessel is small in size.  Left Posterior Atrioventricular Artery Vessel is small in size.  Right Coronary Artery Vessel was injected. Vessel is large. There is mild diffuse disease throughout the  vessel. Prox RCA to Mid RCA lesion is 25% stenosed. The lesion is segmental and eccentric.  Acute Marginal Branch Vessel is small in size.  Right Ventricular Branch Vessel is small in size.  Right Posterior Descending Artery RPDA lesion is 30% stenosed. The lesion is distal to major branch and discrete.  Right Posterior Atrioventricular Artery Vessel is Moderate in size  Second Right Posterolateral Branch Vessel is moderate in size.  Intervention  No interventions have been documented.   CARDIAC CATHETERIZATION  CARDIAC CATHETERIZATION 04/26/2015  Narrative  Mid RCA lesion, 20% stenosed.  Prox LAD lesion, 20% stenosed.  The left ventricular systolic function is normal.  Ost Ramus to Ramus lesion, 80% stenosed. Post intervention, there is a 0% residual stenosis. The lesion was previously treated with a bare metal stent greater than two years ago.  1. Single vessel obstructive CAD with late restenosis of the ramus intermediate branch. 2. Normal LV function 3. Successful stenting of the ramus intermediate with a DES.  Plan: DAPT for one year. Anticipate DC in  am.  Findings Coronary Findings Diagnostic  Dominance: Right  Left Main Vessel was injected. Vessel is normal in caliber. Vessel is angiographically normal.  Left Anterior Descending  First Septal Branch  Ramus Intermedius . Vessel is large. Diffuse. The lesion was previously treated with a bare metal stent greater than two years ago.  Pressure wire/FFR was performed on the lesion.  FFR: 0.76.  Left Circumflex Vessel was injected. Vessel is normal in caliber. Vessel is angiographically normal.  Right Coronary Artery Discrete.  Intervention  Ost Ramus to Ramus lesion PCI The pre-interventional distal flow is normal (TIMI 3). Pre-stent angioplasty was performed. 2.5 mm balloon A drug-eluting stent was placed. Post-stent angioplasty was performed. 3.25 mm [place] balloon Maximum pressure: 16 atm. The  post-interventional distal flow is normal (TIMI 3). The intervention was successful. No complications occurred at this lesion. Supplies used: STENT PROMUS PREM MR 3.0X38 There is a 0% residual stenosis post intervention.              ______________________________________________________________________________________________      EKG:   EKG Interpretation Date/Time:  Monday May 10 2023 08:08:55 EDT Ventricular Rate:  69 PR Interval:  182 QRS Duration:  102 QT Interval:  388 QTC Calculation: 415 R Axis:   60  Text Interpretation: Normal sinus rhythm Normal ECG When compared with ECG of 26-Aug-2020 10:25, No significant change was found Confirmed by Tonny Bollman [phone]) on 05/10/2023 8:23:14 AM    Recent Labs: No results found for requested labs within last 365 days.  Recent Lipid Panel    Component Value Date/Time   CHOL 89 (L) 11/19/2020 0745   TRIG 147 11/19/2020 0745   HDL 34 (L) 11/19/2020 0745   CHOLHDL 2.6 11/19/2020 0745   CHOLHDL 3.0 08/27/2020 0500   VLDL 37 08/27/2020 0500   LDLCALC 30 11/19/2020 0745     Risk Assessment/Calculations:                Physical Exam:    VS:  BP 126/72   Pulse 69   Ht 5\' 11"  (1.803 m)   Wt 205 lb 9.6 oz (93.3 kg)   SpO2 97%   BMI 28.68 kg/m     Wt Readings from Last 3 Encounters:  05/10/23 205 lb 9.6 oz (93.3 kg)  05/14/22 208 lb 12.8 oz (94.7 kg)  03/24/21 213 lb (96.6 kg)     GEN:  Well nourished, well developed in no acute distress HEENT: Normal NECK: No JVD; No carotid bruits LYMPHATICS: No lymphadenopathy CARDIAC: RRR, no murmurs, rubs, gallops RESPIRATORY:  Clear to auscultation without rales, wheezing or rhonchi  ABDOMEN: Soft, non-tender, non-distended MUSCULOSKELETAL:  No edema; No deformity  SKIN: Warm and dry NEUROLOGIC:  Alert and oriented x 3 PSYCHIATRIC:  Normal affect   Assessment & Plan Coronary artery disease of native artery of native heart with stable angina pectoris (HCC) The  patient is doing well on his current regimen which includes aspirin for antiplatelet therapy and moderate dose rosuvastatin 20 mg daily.  Will continue current management.  We discussed resistance training, diet that emphasizes nutrient rich foods, and regular exercise.  His weight has trended down consistently over the last 5 years and we reviewed his weight chart today. Mixed hyperlipidemia Lipids as outlined above, LDL at goal of less than 55, continue rosuvastatin and fenofibrate Essential hypertension Blood pressure well-controlled on valsartan/HCTZ Type 2 diabetes mellitus with complication, without long-term current use of insulin (HCC) He will focus on low glycemic diet and continue  metformin, followed closely by his primary care physician. Chest pain, unspecified type             Medication Adjustments/Labs and Tests Ordered: Current medicines are reviewed at length with the patient today.  Concerns regarding medicines are outlined above.  Orders Placed This Encounter  Procedures   EKG 12-Lead   Meds ordered this encounter  Medications   nitroGLYCERIN (NITROSTAT) 0.4 MG SL tablet    Sig: Place 1 tablet (0.4 mg total) under the tongue every 5 (five) minutes as needed for chest pain (x 3 doses).    Dispense:  30 tablet    Refill:  3   rosuvastatin (CRESTOR) 20 MG tablet    Sig: Take 1 tablet (20 mg total) by mouth daily.    Dispense:  90 tablet    Refill:  3    Patient Instructions  Follow-Up: At [facility], you and your health needs are our priority.  As part of our continuing mission to provide you with exceptional heart care, our providers are all part of one team.  This team includes your primary Cardiologist (physician) and Advanced Practice Providers or APPs (Physician Assistants and Nurse Practitioners) who all work together to provide you with the care you need, when you need it.  Your next appointment:   1 year(s)  Provider:   Tonny Bollman, MD         1st Floor: - Lobby - Registration  - Pharmacy  - Lab - Cafe  2nd Floor: - PV Lab - Diagnostic Testing (echo, CT, nuclear med)  3rd Floor: - Vacant  4th Floor: - TCTS (cardiothoracic surgery) - AFib Clinic - Structural Heart Clinic - Vascular Surgery  - Vascular Ultrasound  5th Floor: - HeartCare Cardiology (general and EP) - Clinical Pharmacy for coumadin, hypertension, lipid, weight-loss medications, and med management appointments    Valet parking services will be available as well.     Signed, Tonny Bollman, MD  05/10/2023 5:21 PM    [facility]

## 2023-05-14 ENCOUNTER — Encounter: Payer: Self-pay | Admitting: Internal Medicine

## 2023-05-24 DIAGNOSIS — G4733 Obstructive sleep apnea (adult) (pediatric): Secondary | ICD-10-CM | POA: Diagnosis not present

## 2023-05-27 DIAGNOSIS — G4733 Obstructive sleep apnea (adult) (pediatric): Secondary | ICD-10-CM | POA: Diagnosis not present

## 2023-06-07 DIAGNOSIS — K5080 Crohn's disease of both small and large intestine without complications: Secondary | ICD-10-CM | POA: Diagnosis not present

## 2023-06-07 DIAGNOSIS — Z9049 Acquired absence of other specified parts of digestive tract: Secondary | ICD-10-CM | POA: Diagnosis not present

## 2023-08-10 DIAGNOSIS — H25813 Combined forms of age-related cataract, bilateral: Secondary | ICD-10-CM | POA: Diagnosis not present

## 2023-08-10 DIAGNOSIS — H40013 Open angle with borderline findings, low risk, bilateral: Secondary | ICD-10-CM | POA: Diagnosis not present

## 2023-08-10 DIAGNOSIS — H04123 Dry eye syndrome of bilateral lacrimal glands: Secondary | ICD-10-CM | POA: Diagnosis not present

## 2023-08-10 DIAGNOSIS — R7309 Other abnormal glucose: Secondary | ICD-10-CM | POA: Diagnosis not present

## 2023-09-13 DIAGNOSIS — L578 Other skin changes due to chronic exposure to nonionizing radiation: Secondary | ICD-10-CM | POA: Diagnosis not present

## 2023-09-13 DIAGNOSIS — L814 Other melanin hyperpigmentation: Secondary | ICD-10-CM | POA: Diagnosis not present

## 2023-09-13 DIAGNOSIS — D229 Melanocytic nevi, unspecified: Secondary | ICD-10-CM | POA: Diagnosis not present

## 2023-09-13 DIAGNOSIS — B078 Other viral warts: Secondary | ICD-10-CM | POA: Diagnosis not present

## 2023-09-13 DIAGNOSIS — L570 Actinic keratosis: Secondary | ICD-10-CM | POA: Diagnosis not present

## 2023-09-13 DIAGNOSIS — L821 Other seborrheic keratosis: Secondary | ICD-10-CM | POA: Diagnosis not present

## 2023-09-13 DIAGNOSIS — D1801 Hemangioma of skin and subcutaneous tissue: Secondary | ICD-10-CM | POA: Diagnosis not present

## 2023-09-20 DIAGNOSIS — G4734 Idiopathic sleep related nonobstructive alveolar hypoventilation: Secondary | ICD-10-CM | POA: Diagnosis not present

## 2023-09-20 DIAGNOSIS — G4733 Obstructive sleep apnea (adult) (pediatric): Secondary | ICD-10-CM | POA: Diagnosis not present

## 2023-10-07 DIAGNOSIS — G4730 Sleep apnea, unspecified: Secondary | ICD-10-CM | POA: Diagnosis not present

## 2023-10-07 DIAGNOSIS — R0902 Hypoxemia: Secondary | ICD-10-CM | POA: Diagnosis not present

## 2023-12-03 DIAGNOSIS — Z9049 Acquired absence of other specified parts of digestive tract: Secondary | ICD-10-CM | POA: Diagnosis not present

## 2023-12-03 DIAGNOSIS — K5080 Crohn's disease of both small and large intestine without complications: Secondary | ICD-10-CM | POA: Diagnosis not present

## 2023-12-23 DIAGNOSIS — Z23 Encounter for immunization: Secondary | ICD-10-CM | POA: Diagnosis not present

## 2024-01-04 DIAGNOSIS — K5080 Crohn's disease of both small and large intestine without complications: Secondary | ICD-10-CM | POA: Diagnosis not present

## 2024-01-05 DIAGNOSIS — K5090 Crohn's disease, unspecified, without complications: Secondary | ICD-10-CM | POA: Diagnosis not present

## 2024-01-05 DIAGNOSIS — K6389 Other specified diseases of intestine: Secondary | ICD-10-CM | POA: Diagnosis not present

## 2024-01-05 DIAGNOSIS — Z980 Intestinal bypass and anastomosis status: Secondary | ICD-10-CM | POA: Diagnosis not present

## 2024-01-05 DIAGNOSIS — K648 Other hemorrhoids: Secondary | ICD-10-CM | POA: Diagnosis not present

## 2024-05-22 ENCOUNTER — Ambulatory Visit: Admitting: Cardiovascular Disease
